# Patient Record
Sex: Female | Born: 1975 | Race: White | Hispanic: No | Marital: Married | Smoking: Current every day smoker
Health system: Southern US, Community
[De-identification: ages and names within clinical notes are randomized; demographics above are authoritative.]

## PROBLEM LIST (undated history)

## (undated) DIAGNOSIS — N912 Amenorrhea, unspecified: Secondary | ICD-10-CM

## (undated) DIAGNOSIS — N926 Irregular menstruation, unspecified: Secondary | ICD-10-CM

## (undated) HISTORY — DX: Irregular menstruation, unspecified: N92.6

## (undated) HISTORY — PX: OTHER SURGICAL HISTORY: SHX169

## (undated) HISTORY — DX: Amenorrhea, unspecified: N91.2

---

## 2005-12-11 ENCOUNTER — Emergency Department: Payer: Self-pay | Admitting: Unknown Physician Specialty

## 2007-01-15 ENCOUNTER — Emergency Department: Payer: Self-pay | Admitting: Emergency Medicine

## 2007-04-24 ENCOUNTER — Emergency Department: Payer: Self-pay | Admitting: Unknown Physician Specialty

## 2007-05-03 ENCOUNTER — Emergency Department: Payer: Self-pay | Admitting: Emergency Medicine

## 2016-11-11 ENCOUNTER — Ambulatory Visit (INDEPENDENT_AMBULATORY_CARE_PROVIDER_SITE_OTHER): Payer: Medicaid Other

## 2016-11-11 ENCOUNTER — Ambulatory Visit (INDEPENDENT_AMBULATORY_CARE_PROVIDER_SITE_OTHER): Payer: Medicaid Other | Admitting: Obstetrics and Gynecology

## 2016-11-11 VITALS — BP 117/85 | HR 96 | Ht 64.0 in | Wt 254.3 lb

## 2016-11-11 DIAGNOSIS — Z1389 Encounter for screening for other disorder: Secondary | ICD-10-CM

## 2016-11-11 DIAGNOSIS — Z3A08 8 weeks gestation of pregnancy: Secondary | ICD-10-CM

## 2016-11-11 DIAGNOSIS — Z8489 Family history of other specified conditions: Secondary | ICD-10-CM | POA: Diagnosis not present

## 2016-11-11 DIAGNOSIS — Z3481 Encounter for supervision of other normal pregnancy, first trimester: Secondary | ICD-10-CM | POA: Diagnosis not present

## 2016-11-11 DIAGNOSIS — Z113 Encounter for screening for infections with a predominantly sexual mode of transmission: Secondary | ICD-10-CM

## 2016-11-11 DIAGNOSIS — IMO0001 Reserved for inherently not codable concepts without codable children: Secondary | ICD-10-CM

## 2016-11-11 DIAGNOSIS — Z369 Encounter for antenatal screening, unspecified: Secondary | ICD-10-CM

## 2016-11-11 DIAGNOSIS — N926 Irregular menstruation, unspecified: Secondary | ICD-10-CM

## 2016-11-11 DIAGNOSIS — K219 Gastro-esophageal reflux disease without esophagitis: Secondary | ICD-10-CM

## 2016-11-11 DIAGNOSIS — T7589XA Other specified effects of external causes, initial encounter: Secondary | ICD-10-CM

## 2016-11-11 LAB — POCT URINALYSIS DIPSTICK
Bilirubin, UA: NEGATIVE
Glucose, UA: NEGATIVE
Ketones, UA: NEGATIVE
LEUKOCYTES UA: NEGATIVE
NITRITE UA: NEGATIVE
PH UA: 6
Protein, UA: NEGATIVE
RBC UA: NEGATIVE
Spec Grav, UA: 1.025
UROBILINOGEN UA: NEGATIVE

## 2016-11-11 NOTE — Patient Instructions (Signed)
Pregnancy and Zika Virus Disease Introduction Zika virus disease, or Zika, is an illness that can spread to people from mosquitoes that carry the virus. It may also spread from person to person through infected body fluids. Zika first occurred in [place], but recently it has spread to new areas. The virus occurs in tropical climates. The location of Zika continues to change. Most people who become infected with Zika virus do not develop serious illness. However, Zika may cause birth defects in an unborn baby whose mother is infected with the virus. It may also increase the risk of miscarriage. What are the symptoms of Zika virus disease? In many cases, people who have been infected with Zika virus do not develop any symptoms. If symptoms appear, they usually start about a week after the person is infected. Symptoms are usually mild. They may include:  Fever.  Rash.  Red eyes.  Joint pain. How does Zika virus disease spread? The main way that Zika virus spreads is through the bite of a certain type of mosquito. Unlike most types of mosquitos, which bite only at night, the type of mosquito that carries Zika virus bites both at night and during the day. Zika virus can also spread through sexual contact, through a blood transfusion, and from a mother to her baby before or during birth. Once you have had Zika virus disease, it is unlikely that you will get it again. Can I pass Zika to my baby during pregnancy? Yes, Zika can pass from a mother to her baby before or during birth. What problems can Zika cause for my baby? A woman who is infected with Zika virus while pregnant is at risk of having her baby born with a condition in which the brain or head is smaller than expected (microcephaly). Babies who have microcephaly can have developmental delays, seizures, hearing problems, and vision problems. Having Zika virus disease during pregnancy can also increase the risk of miscarriage. How can Zika  virus disease be prevented? There is no vaccine to prevent Zika. The best way to prevent the disease is to avoid infected mosquitoes and avoid exposure to body fluids that can spread the virus. Avoid any possible exposure to Zika by taking the following precautions. For women and their sex partners:  Avoid traveling to high-risk areas. The locations where Zika is being reported change often. To identify high-risk areas, check the CDC travel website: www.cdc.gov/zika/geo/index.html  If you or your sex partner must travel to a high-risk area, talk with a health care provider before and after traveling.  Take all precautions to avoid mosquito bites if you live in, or travel to, any of the high-risk areas. Insect repellents are safe to use during pregnancy.  Ask your health care provider when it is safe to have sexual contact. For women:  If you are pregnant or trying to become pregnant, avoid sexual contact with persons who may have been exposed to Zika virus, persons who have possible symptoms of Zika, or persons whose history you are unsure about. If you choose to have sexual contact with someone who may have been exposed to Zika virus, use condoms correctly during the entire duration of sexual activity, every time. Do not share sexual devices, as you may be exposed to body fluids.  Ask your health care provider about when it is safe to attempt pregnancy after a possible exposure to Zika virus. What steps should I take to avoid mosquito bites? Take these steps to avoid mosquito bites when you   are in a high-risk area:  Wear loose clothing that covers your arms and legs.  Limit your outdoor activities.  Do not open windows unless they have window screens.  Sleep under mosquito nets.  Use insect repellent. The best insect repellents have:  DEET, picaridin, oil of lemon eucalyptus (OLE), or [id number] in them.  Higher amounts of an active ingredient in them.  Remember that insect repellents  are safe to use during pregnancy.  Do not use OLE on children who are younger than 3 years of age. Do not use insect repellent on babies who are younger than 2 months of age.  Cover your child's stroller with mosquito netting. Make sure the netting fits snugly and that any loose netting does not cover your child's mouth or nose. Do not use a blanket as a mosquito-protection cover.  Do not apply insect repellent underneath clothing.  If you are using sunscreen, apply the sunscreen before applying the insect repellent.  Treat clothing with permethrin. Do not apply permethrin directly to your skin. Follow label directions for safe use.  Get rid of standing water, where mosquitoes may reproduce. Standing water is often found in items such as buckets, bowls, animal food dishes, and flowerpots. When you return from traveling to any high-risk area, continue taking actions to protect yourself against mosquito bites for 3 weeks, even if you show no signs of illness. This will prevent spreading Zika virus to uninfected mosquitoes. What should I know about the sexual transmission of Zika? People can spread Zika to their sexual partners during vaginal, anal, or oral sex, or by sharing sexual devices. Many people with Zika do not develop symptoms, so a person could spread the disease without knowing that they are infected. The greatest risk is to women who are pregnant or who may become pregnant. Zika virus can live longer in semen than it can live in blood. Couples can prevent sexual transmission of the virus by:  Using condoms correctly during the entire duration of sexual activity, every time. This includes vaginal, anal, and oral sex.  Not sharing sexual devices. Sharing increases your risk of being exposed to body fluid from another person.  Avoiding all sexual activity until your health care provider says it is safe. Should I be tested for Zika virus? A sample of your blood can be tested for Zika  virus. A pregnant woman should be tested if she may have been exposed to the virus or if she has symptoms of Zika. She may also have additional tests done during her pregnancy, such ultrasound testing. Talk with your health care provider about which tests are recommended. This information is not intended to replace advice given to you by your health care provider. Make sure you discuss any questions you have with your health care provider. Document Released: 08/13/2015 Document Revised: 04/29/2016 Document Reviewed: 08/06/2015  2017 Elsevier Minor Illnesses and Medications in Pregnancy  Cold/Flu:  Sudafed for congestion- Robitussin (plain) for cough- Tylenol for discomfort.  Please follow the directions on the label.  Try not to take any more than needed.  OTC Saline nasal spray and air humidifier or cool-mist  Vaporizer to sooth nasal irritation and to loosen congestion.  It is also important to increase intake of non carbonated fluids, especially if you have a fever.  Constipation:  Colace-2 capsules at bedtime; Metamucil- follow directions on label; Senokot- 1 tablet at bedtime.  Any one of these medications can be used.  It is also very important to increase   fluids and fruits along with regular exercise.  If problem persists please call the office.  Diarrhea:  Kaopectate as directed on the label.  Eat a bland diet and increase fluids.  Avoid highly seasoned foods.  Headache:  Tylenol 1 or 2 tablets every 3-4 hours as needed  Indigestion:  Maalox, Mylanta, Tums or Rolaids- as directed on label.  Also try to eat small meals and avoid fatty, greasy or spicy foods.  Nausea with or without Vomiting:  Nausea in pregnancy is caused by increased levels of hormones in the body which influence the digestive system and cause irritation when stomach acids accumulate.  Symptoms usually subside after 1st trimester of pregnancy.  Try the following: 1. Keep saltines, graham crackers or dry toast by your bed to  eat upon awakening. 2. Don't let your stomach get empty.  Try to eat 5-6 small meals per day instead of 3 large ones. 3. Avoid greasy fatty or highly seasoned foods.  4. Take OTC Unisom 1 tablet at bed time along with OTC Vitamin B6 25-50 mg 3 times per day.    If nausea continues with vomiting and you are unable to keep down food and fluids you may need a prescription medication.  Please notify your provider.   Sore throat:  Chloraseptic spray, throat lozenges and or plain Tylenol.  Vaginal Yeast Infection:  OTC Monistat for 7 days as directed on label.  If symptoms do not resolve within a week notify provider.  If any of the above problems do not subside with recommended treatment please call the office for further assistance.   Do not take Aspirin, Advil, Motrin or Ibuprofen.  * * OTC= Over the counter Hyperemesis Gravidarum Hyperemesis gravidarum is a severe form of nausea and vomiting that happens during pregnancy. Hyperemesis is worse than morning sickness. It may cause you to have nausea or vomiting all day for many days. It may keep you from eating and drinking enough food and liquids. Hyperemesis usually occurs during the first half (the first 20 weeks) of pregnancy. It often goes away once a woman is in her second half of pregnancy. However, sometimes hyperemesis continues through an entire pregnancy. What are the causes? The cause of this condition is not known. It may be related to changes in chemicals (hormones) in the body during pregnancy, such as the high level of pregnancy hormone (human chorionic gonadotropin) or the increase in the female sex hormone (estrogen). What are the signs or symptoms? Symptoms of this condition include:  Severe nausea and vomiting.  Nausea that does not go away.  Vomiting that does not allow you to keep any food down.  Weight loss.  Body fluid loss (dehydration).  Having no desire to eat, or not liking food that you have previously  enjoyed. How is this diagnosed? This condition may be diagnosed based on:  A physical exam.  Your medical history.  Your symptoms.  Blood tests.  Urine tests. How is this treated? This condition may be managed with medicine. If medicines to do not help relieve nausea and vomiting, you may need to receive fluids through an IV tube at the hospital. Follow these instructions at home:  Take over-the-counter and prescription medicines only as told by your health care provider.  Avoid iron pills and multivitamins that contain iron for the first 3-4 months of pregnancy. If you take prescription iron pills, do not stop taking them unless your health care provider approves.  Take the following actions to help   prevent nausea and vomiting:  In the morning, before getting out of bed, try eating a couple of dry crackers or a piece of toast.  Avoid foods and smells that upset your stomach. Fatty and spicy foods may make nausea worse.  Eat 5-6 small meals a day.  Do not drink fluids while eating meals. Drink between meals.  Eat or suck on things that have ginger in them. Ginger can help relieve nausea.  Avoid food preparation. The smell of food can spoil your appetite or trigger nausea.  Follow instructions from your health care provider about eating or drinking restrictions.  For snacks, eat high-protein foods, such as cheese.  Keep all follow-up and pre-birth (prenatal) visits as told by your health care provider. This is important. Contact a health care provider if:  You have pain in your abdomen.  You have a severe headache.  You have vision problems.  You are losing weight. Get help right away if:  You cannot drink fluids without vomiting.  You vomit blood.  You have constant nausea and vomiting.  You are very weak.  You are very thirsty.  You feel dizzy.  You faint.  You have a fever or other symptoms that last for more than 2-3 days.  You have a fever and  your symptoms suddenly get worse. Summary  Hyperemesis gravidarum is a severe form of nausea and vomiting that happens during pregnancy.  Making some changes to your eating habits may help relieve nausea and vomiting.  This condition may be managed with medicine.  If medicines to do not help relieve nausea and vomiting, you may need to receive fluids through an IV tube at the hospital. This information is not intended to replace advice given to you by your health care provider. Make sure you discuss any questions you have with your health care provider. Document Released: 11/22/2005 Document Revised: 07/21/2016 Document Reviewed: 07/21/2016 Elsevier Interactive Patient Education  2017 [employer] Commonly Asked Questions During Pregnancy  Cats: A parasite can be excreted in cat feces.  To avoid exposure you need to have another person empty the little box.  If you must empty the litter box you will need to wear gloves.  Wash your hands after handling your cat.  This parasite can also be found in raw or undercooked meat so this should also be avoided.  Colds, Sore Throats, Flu: Please check your medication sheet to see what you can take for symptoms.  If your symptoms are unrelieved by these medications please call the office.  Dental Work: Most any dental work your dentist recommends is permitted.  X-rays should only be taken during the first trimester if absolutely necessary.  Your abdomen should be shielded with a lead apron during all x-rays.  Please notify your provider prior to receiving any x-rays.  Novocaine is fine; gas is not recommended.  If your dentist requires a note from us prior to dental work please call the office and we will provide one for you.  Exercise: Exercise is an important part of staying healthy during your pregnancy.  You may continue most exercises you were accustomed to prior to pregnancy.  Later in your pregnancy you will most likely notice you have difficulty  with activities requiring balance like riding a bicycle.  It is important that you listen to your body and avoid activities that put you at a higher risk of falling.  Adequate rest and staying well hydrated are a must!  If you have questions   about the safety of specific activities ask your provider.    Exposure to Children with illness: Try to avoid obvious exposure; report any symptoms to us when noted,  If you have chicken pos, red measles or mumps, you should be immune to these diseases.   Please do not take any vaccines while pregnant unless you have checked with your OB provider.  Fetal Movement: After 28 weeks we recommend you do "kick counts" twice daily.  Lie or sit down in a calm quiet environment and count your baby movements "kicks".  You should feel your baby at least 10 times per hour.  If you have not felt 10 kicks within the first hour get up, walk around and have something sweet to eat or drink then repeat for an additional hour.  If count remains less than 10 per hour notify your provider.  Fumigating: Follow your pest control agent's advice as to how long to stay out of your home.  Ventilate the area well before re-entering.  Hemorrhoids:   Most over-the-counter preparations can be used during pregnancy.  Check your medication to see what is safe to use.  It is important to use a stool softener or fiber in your diet and to drink lots of liquids.  If hemorrhoids seem to be getting worse please call the office.   Hot Tubs:  Hot tubs Jacuzzis and saunas are not recommended while pregnant.  These increase your internal body temperature and should be avoided.  Intercourse:  Sexual intercourse is safe during pregnancy as long as you are comfortable, unless otherwise advised by your provider.  Spotting may occur after intercourse; report any bright red bleeding that is heavier than spotting.  Labor:  If you know that you are in labor, please go to the hospital.  If you are unsure, please  call the office and let us help you decide what to do.  Lifting, straining, etc:  If your job requires heavy lifting or straining please check with your provider for any limitations.  Generally, you should not lift items heavier than that you can lift simply with your hands and arms (no back muscles)  Painting:  Paint fumes do not harm your pregnancy, but may make you ill and should be avoided if possible.  Latex or water based paints have less odor than oils.  Use adequate ventilation while painting.  Permanents & Hair Color:  Chemicals in hair dyes are not recommended as they cause increase hair dryness which can increase hair loss during pregnancy.  " Highlighting" and permanents are allowed.  Dye may be absorbed differently and permanents may not hold as well during pregnancy.  Sunbathing:  Use a sunscreen, as skin burns easily during pregnancy.  Drink plenty of fluids; avoid over heating.  Tanning Beds:  Because their possible side effects are still unknown, tanning beds are not recommended.  Ultrasound Scans:  Routine ultrasounds are performed at approximately 20 weeks.  You will be able to see your baby's general anatomy an if you would like to know the gender this can usually be determined as well.  If it is questionable when you conceived you may also receive an ultrasound early in your pregnancy for dating purposes.  Otherwise ultrasound exams are not routinely performed unless there is a medical necessity.  Although you can request a scan we ask that you pay for it when conducted because insurance does not cover " patient request" scans.  Work: If your pregnancy proceeds without complications you   may work until your due date, unless your physician or employer advises otherwise.  Round Ligament Pain/Pelvic Discomfort:  Sharp, shooting pains not associated with bleeding are fairly common, usually occurring in the second trimester of pregnancy.  They tend to be worse when standing up or when  you remain standing for long periods of time.  These are the result of pressure of certain pelvic ligaments called "round ligaments".  Rest, Tylenol and heat seem to be the most effective relief.  As the womb and fetus grow, they rise out of the pelvis and the discomfort improves.  Please notify the office if your pain seems different than that described.  It may represent a more serious condition.   

## 2016-11-11 NOTE — Progress Notes (Signed)
Amy CaveKimberly Beasley presents for NOB nurse interview visit. Pregnancy confirmation done @ [facility] on 11/04/2016 with positive UPT.   G-4. [id number]. Pregnancy education material explained and given. Has cats in the home. Toxoplasmosis labs ordered.  NOB labs ordered. TSH/HbgA1c due to Increased BMI, HIV labs and Drug screen were explained optional and she did not decline. Drug screen ordered. Pt c/o lower abdominal pain and frequency urinating. Urinalysis negative in office today. Urine will also be sent to lab for ua and uc. PNV encouraged. Genetic screening, pt will most likely do the MaterniT after 9 wks. Is aware to call to make an appt with lab, or may discuss with provider. Ultrasound ordered because of hx irregular menses, FHX  maternal and paternal fraternal and identical twins, most on mother's side who is an identical twin. Pt. To follow up with provider in 4 weeks for NOB physical.  All questions answered.

## 2016-11-13 LAB — CBC WITH DIFFERENTIAL/PLATELET
BASOS: 0 %
Basophils Absolute: 0 10*3/uL (ref 0.0–0.2)
EOS (ABSOLUTE): 0.2 10*3/uL (ref 0.0–0.4)
EOS: 3 %
HEMATOCRIT: 40.9 % (ref 34.0–46.6)
HEMOGLOBIN: 14.1 g/dL (ref 11.1–15.9)
IMMATURE GRANS (ABS): 0 10*3/uL (ref 0.0–0.1)
Immature Granulocytes: 0 %
LYMPHS ABS: 2.5 10*3/uL (ref 0.7–3.1)
LYMPHS: 31 %
MCH: 29.6 pg (ref 26.6–33.0)
MCHC: 34.5 g/dL (ref 31.5–35.7)
MCV: 86 fL (ref 79–97)
MONOCYTES: 7 %
Monocytes Absolute: 0.5 10*3/uL (ref 0.1–0.9)
NEUTROS ABS: 4.7 10*3/uL (ref 1.4–7.0)
Neutrophils: 59 %
Platelets: 283 10*3/uL (ref 150–379)
RBC: 4.77 x10E6/uL (ref 3.77–5.28)
RDW: 14.2 % (ref 12.3–15.4)
WBC: 8 10*3/uL (ref 3.4–10.8)

## 2016-11-13 LAB — TOXOPLASMA ANTIBODIES- IGG AND  IGM: Toxoplasma Antibody- IgM: <3 AU/mL (ref 0.0–7.9)

## 2016-11-13 LAB — MONITOR DRUG PROFILE 14(MW)
Amphetamine Scrn, Ur: NEGATIVE ng/mL
BARBITURATE SCREEN URINE: NEGATIVE ng/mL
BENZODIAZEPINE SCREEN, URINE: NEGATIVE ng/mL
BUPRENORPHINE, URINE: NEGATIVE ng/mL
CANNABINOIDS UR QL SCN: NEGATIVE ng/mL
COCAINE(METAB.)SCREEN, URINE: NEGATIVE ng/mL
CREATININE(CRT), U: 29.2 mg/dL (ref 20.0–300.0)
Fentanyl, Urine: NEGATIVE pg/mL
MEPERIDINE SCREEN, URINE: NEGATIVE ng/mL
METHADONE SCREEN, URINE: NEGATIVE ng/mL
OPIATE SCREEN URINE: NEGATIVE ng/mL
OXYCODONE+OXYMORPHONE UR QL SCN: NEGATIVE ng/mL
Ph of Urine: 6 (ref 4.5–8.9)
Phencyclidine Qn, Ur: NEGATIVE ng/mL
Propoxyphene Scrn, Ur: NEGATIVE ng/mL
SPECIFIC GRAVITY: 1.007
TRAMADOL SCREEN, URINE: NEGATIVE ng/mL

## 2016-11-13 LAB — URINALYSIS, ROUTINE W REFLEX MICROSCOPIC
BILIRUBIN UA: NEGATIVE
Glucose, UA: NEGATIVE
Ketones, UA: NEGATIVE
LEUKOCYTES UA: NEGATIVE
Nitrite, UA: NEGATIVE
PH UA: 6 (ref 5.0–7.5)
PROTEIN UA: NEGATIVE
RBC UA: NEGATIVE
Specific Gravity, UA: 1.007 (ref 1.005–1.030)
Urobilinogen, Ur: 0.2 mg/dL (ref 0.2–1.0)

## 2016-11-13 LAB — NICOTINE SCREEN, URINE: Cotinine Ql Scrn, Ur: POSITIVE ng/mL

## 2016-11-13 LAB — VARICELLA ZOSTER ANTIBODY, IGG: Varicella zoster IgG: 885 index (ref >165)

## 2016-11-13 LAB — HEMOGLOBIN A1C
ESTIMATED AVERAGE GLUCOSE: 111 mg/dL
Hgb A1c MFr Bld: 5.5 % (ref 4.8–5.6)

## 2016-11-13 LAB — ANTIBODY SCREEN: Antibody Screen: NEGATIVE

## 2016-11-13 LAB — RH TYPE: RH TYPE: POSITIVE

## 2016-11-13 LAB — HEPATITIS B SURFACE ANTIGEN: Hepatitis B Surface Ag: NEGATIVE

## 2016-11-13 LAB — RUBELLA SCREEN: Rubella Antibodies, IGG: 3.89 index (ref >0.99)

## 2016-11-13 LAB — URINE CULTURE, OB REFLEX

## 2016-11-13 LAB — RPR: RPR Ser Ql: NONREACTIVE

## 2016-11-13 LAB — TSH: TSH: 0.962 u[IU]/mL (ref 0.450–4.500)

## 2016-11-13 LAB — GC/CHLAMYDIA PROBE AMP
Chlamydia trachomatis, NAA: NEGATIVE
NEISSERIA GONORRHOEAE BY PCR: NEGATIVE

## 2016-11-13 LAB — CULTURE, OB URINE

## 2016-11-13 LAB — HIV ANTIBODY (ROUTINE TESTING W REFLEX): HIV Screen 4th Generation wRfx: NONREACTIVE

## 2016-11-13 LAB — ABO

## 2016-11-13 NOTE — Progress Notes (Signed)
I have reviewed the record and concur with patient management and plan as documented by Debbie Ridgeway, LPN.  Londen Lorge, MD [facility]  

## 2016-11-24 ENCOUNTER — Other Ambulatory Visit: Payer: Medicaid Other

## 2016-11-24 DIAGNOSIS — Z3481 Encounter for supervision of other normal pregnancy, first trimester: Secondary | ICD-10-CM

## 2016-11-24 DIAGNOSIS — Z369 Encounter for antenatal screening, unspecified: Secondary | ICD-10-CM

## 2016-12-01 LAB — MATERNIT21  PLUS CORE+ESS+SCA, BLOOD
CHROMOSOME 18: NEGATIVE
CHROMOSOME 21: NEGATIVE
Chromosome 13: NEGATIVE
PDF: 0
Y Chromosome: NOT DETECTED

## 2016-12-02 ENCOUNTER — Telehealth: Payer: Self-pay

## 2016-12-02 NOTE — Telephone Encounter (Signed)
-----   Message from Hildred LaserAnika Cherry, MD sent at 12/01/2016  5:19 PM EST ----- Please inform of normal MaterniT21 testing.  If desires to know sex, is a female infant.

## 2016-12-02 NOTE — Telephone Encounter (Signed)
Called pt no answer, LM for pt to call back  

## 2016-12-03 NOTE — Telephone Encounter (Signed)
Called pt informed her of normal results. Pt gave verbal understanding.  

## 2016-12-09 ENCOUNTER — Ambulatory Visit (INDEPENDENT_AMBULATORY_CARE_PROVIDER_SITE_OTHER): Payer: Medicaid Other

## 2016-12-09 ENCOUNTER — Ambulatory Visit (INDEPENDENT_AMBULATORY_CARE_PROVIDER_SITE_OTHER): Payer: Medicaid Other | Admitting: Obstetrics and Gynecology

## 2016-12-09 VITALS — BP 107/71 | HR 103 | Wt 255.4 lb

## 2016-12-09 DIAGNOSIS — Z3481 Encounter for supervision of other normal pregnancy, first trimester: Secondary | ICD-10-CM | POA: Diagnosis not present

## 2016-12-09 DIAGNOSIS — Z124 Encounter for screening for malignant neoplasm of cervix: Secondary | ICD-10-CM | POA: Diagnosis not present

## 2016-12-09 DIAGNOSIS — O09521 Supervision of elderly multigravida, first trimester: Secondary | ICD-10-CM

## 2016-12-09 DIAGNOSIS — Z3482 Encounter for supervision of other normal pregnancy, second trimester: Secondary | ICD-10-CM | POA: Diagnosis not present

## 2016-12-09 DIAGNOSIS — Z3689 Encounter for other specified antenatal screening: Secondary | ICD-10-CM

## 2016-12-09 DIAGNOSIS — J45909 Unspecified asthma, uncomplicated: Secondary | ICD-10-CM

## 2016-12-09 DIAGNOSIS — O021 Missed abortion: Secondary | ICD-10-CM

## 2016-12-09 LAB — POCT URINALYSIS DIPSTICK
Bilirubin, UA: NEGATIVE
Glucose, UA: NEGATIVE
Ketones, UA: 80
LEUKOCYTES UA: NEGATIVE
NITRITE UA: NEGATIVE
SPEC GRAV UA: 1.02
UROBILINOGEN UA: NEGATIVE
pH, UA: 6

## 2016-12-09 MED ORDER — ALBUTEROL SULFATE HFA 108 (90 BASE) MCG/ACT IN AERS: 2.0000 | INHALATION_SPRAY | Freq: Four times a day (QID) | RESPIRATORY_TRACT | 2 refills | Status: DC | PRN

## 2016-12-09 MED ORDER — BUPROPION HCL ER (SMOKING DET) 150 MG PO TB12
150.0000 mg | ORAL_TABLET | Freq: Two times a day (BID) | ORAL | 3 refills | Status: DC
Start: 2016-12-09 — End: 2017-06-07

## 2016-12-09 NOTE — Progress Notes (Signed)
OBSTETRIC INITIAL PRENATAL VISIT  Subjective:    Amy Beasley is being seen today for her first obstetrical visit.  This is not a planned pregnancy. She is a [id number] female at [redacted]w[redacted]d gestation, Estimated Date of Delivery: 06/26/17 with Patient's last menstrual period was 09/13/2016 (approximate date), inconsistent consistent with 7 week sono. Her obstetrical history is significant for advanced maternal age. Relationship with FOB: spouse, living together. Patient does intend to breast feed. Pregnancy history fully reviewed.    Obstetric History   G4   P3   T3   P0   A0   L3    SAB0   TAB0   Ectopic0   Multiple0   Live Births3     # Outcome Date GA Lbr Len/2nd Weight Sex Delivery Anes PTL Lv  4 Current           3 Term 04/14/07 [redacted]w[redacted]d  7 lb 11 oz (3.487 kg) F Vag-Spont  N LIV  2 Term 05/22/96 [redacted]w[redacted]d  5 lb 13 oz (2.637 kg) F Vag-Spont  N LIV  1 Term 03/28/94 [redacted]w[redacted]d  5 lb 11 oz (2.58 kg) F Vag-Spont  N LIV      Gynecologic History:  Last pap smear was 2008.  Results were normal.  Reports h/o abnormal pap smear x1 in the past. Did not require colposcopy or treatment.  Denies history of STIs.    Past Medical History:  Diagnosis Date  . Amenorrhea   . Irregular menses     Family History  Problem Relation Age of Onset  . Asthma Sister   . Cancer Maternal Grandmother     breast  . Diabetes Maternal Grandmother     type 2  . Hyperlipidemia Maternal Grandmother   . Cancer Maternal Grandfather     prostate cancer  . Hypertension Maternal Grandfather   . Eczema Child     Past Surgical History:  Procedure Laterality Date  . none      Social History   Social History  . Marital status: Married    Spouse name: N/A  . Number of children: N/A  . Years of education: N/A   Occupational History  . Not on file.   Social History Main Topics  . Smoking status: Current Every Day Smoker  . Smokeless tobacco: Never Used  . Alcohol use No  . Drug use: No  . Sexual activity: Yes      Partners: Male    Birth control/ protection: None   Other Topics Concern  . Not on file   Social History Narrative  . No narrative on file    Current Outpatient Prescriptions on File Prior to Visit  Medication Sig Dispense Refill  . omeprazole (PRILOSEC) 20 MG capsule Take 20 mg by mouth daily.    . Prenatal Vit-Fe Fumarate-FA (PRENATAL VITAMINS) 28-0.8 MG TABS Take by mouth.     No current facility-administered medications on file prior to visit.      No Known Allergies   Review of Systems General:Not Present- Fever, Weight Loss and Weight Gain. Skin:Not Present- Rash. HEENT:Not Present- Blurred Vision, Headache and Bleeding Gums. Respiratory:Present- Difficulty Breathing with exertion, mostly in cold weather.  Also notes issues in the past with difficulty with breathing after moving into a new home (thinks it was due to dust mites). Not present - negative for - cough or wheezing  Breast:Not Present- Breast Mass. Cardiovascular:Not Present- Chest Pain, Elevated Blood Pressure, Fainting / Blacking Out and Shortness of Breath. Gastrointestinal:Not Present-  Abdominal Pain, Constipation, Nausea and Vomiting. Female Genitourinary:Not Present- Frequency, Painful Urination, Pelvic Pain, Vaginal Bleeding, Vaginal Discharge, Contractions, regular, Fetal Movements Decreased, Urinary Complaints and Vaginal Fluid. Musculoskeletal:Not Present- Back Pain and Leg Cramps. Neurological:Not Present- Dizziness. Psychiatric:Not Present- Depression.     Objective:   Blood pressure 107/71, pulse (!) 103, weight 255 lb 6.4 oz (115.8 kg), last menstrual period 09/13/2016.  Body mass index is 43.84 kg/m.   General Appearance:    Alert, cooperative, no distress, appears stated age, morbid obesity  Head:    Normocephalic, without obvious abnormality, atraumatic  Eyes:    PERRL, conjunctiva/corneas clear, EOM's intact, both eyes  Ears:    Normal external ear canals, both ears  Nose:    Nares normal, septum midline, mucosa normal, no drainage or sinus tenderness  Throat:   Lips, mucosa, and tongue normal; teeth and gums normal  Neck:   Supple, symmetrical, trachea midline, no adenopathy; thyroid: no enlargement/tenderness/nodules; no carotid bruit or JVD  Back:     Symmetric, no curvature, ROM normal, no CVA tenderness  Lungs:     Clear to auscultation bilaterally, respirations unlabored  Chest Wall:    No tenderness or deformity   Heart:    Regular rate and rhythm, S1 and S2 normal, no murmur, rub or gallop  Breast Exam:    No tenderness, masses, or nipple abnormality  Abdomen:     Soft, non-tender, bowel sounds active all four quadrants, no masses, no organomegaly.  FH unable to determine due to body habitus.  FHT unable to determine by Dopplers.  Genitalia:    Pelvic:external genitalia normal, vagina without lesions, discharge, or tenderness, rectovaginal septum  normal. Cervix normal in appearance, no cervical motion tenderness, no adnexal masses or tenderness.  Pregnancy positive findings: uterine enlargement: 8-10 wk size, nontender.   Rectal:    Normal external sphincter.  No hemorrhoids appreciated. Internal exam not done.   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 Prenatal vitamins encouraged. Problem list reviewed and updated. New OB counseling:  The patient has been given an overview regarding routine prenatal care.  Recommendations regarding diet, weight gain, and exercise in pregnancy were given.   Patient should aim to gain no more than 15-20 lbs for entire pregnancy.  Prenatal testing, optional genetic testing, and ultrasound use in pregnancy were reviewed.  AFP3 discussed: declined.  Patient had MaterniT21 testing which noted normal fetus.  Benefits of Breast Feeding were discussed. The patient is encouraged to consider nursing her baby post partum. Prescribed albuterol inhaler for asthma.  Patient notes that she has tried several antihistamines, including Zyrtec, CLaritin, and Benadryl which do not help.  Is currently taking Chlorpheniramine (is ok to take In pregnancy).   Will need early glucola secondary to morbid obesity.  Discussed smoking cessation.  Patient currently smoking 1/2 to 3/4 ppd.  Has tried nicotine gum and patch in the past with no success.  Advised on Bupropion vs Chantix, including risks vs benefits in pregnancy. .  Patient desires to  try Bupropion.  Prescription given. Also given smoking cessation tools including website and hotline.  Follow up in 4 weeks.  50% of 30 min visit spent on counseling and coordination of care.       Addendum 12/10/2015, 3:48 PM:  Ultrasound performed due to inability to assess heart tones by Doppler.  Ultrasound notes fetal demise at ~ [redacted] weeks gestation.  Discussed ultrasound results with patient.  All questions answered.  Discussed management of missed abortion: expectant management vs misoprostol vs D&E.  Risks and benefits of all modalities discussed; all questions answered.  Patient opted for expectant management for now.  She was told to call if she changes her mind, or if there is no passage of tissue in 4 weeks or develops any symptoms of infection.  Bleeding precautions reviewed; she was told to call clinic or come to the Emergency Roomfor any concerns.  She will have a BHCG drawn tomorrow, and repeat on Monday (for patient reassurance that pregnancy has ended).   If patient changes her mind and opts for D&E, she was advised to call  the office to have procedure scheduled.  Risks of surgery including bleeding, infection, injury to surrounding organs, need for additional procedures, possibility of intrauterine scarring which may impair future fertility, risk of retained products which may require further management and other postoperative/anesthesia complications will be explained to patient.  If she changes her mind and wants misoprostol administration, she will need to contact the office for a prescription.  Risks and benefits of medical management were carefully explained, including a success rate of 80-90%, the need for another person to be at home with her, and to call/come in if she had heavy bleeding, dizziness, or severe pain not relieved by medication.  She will follow up in one week after misoprostol administration; if there has been no passage of pregnancy, will consider repeat misoprostol vs D&E.  Patient should again be advised to call or come in for evaluation for any concerning symptoms; bleeding precautions should be strictly emphasized.    An additional 20 minutes were spent on counseling patient.    Hildred LaserAnika Juanmanuel Marohl, MD [facility]

## 2016-12-09 NOTE — Patient Instructions (Addendum)

## 2016-12-10 ENCOUNTER — Other Ambulatory Visit: Payer: Medicaid Other

## 2016-12-10 ENCOUNTER — Encounter: Payer: Self-pay | Admitting: Obstetrics and Gynecology

## 2016-12-10 DIAGNOSIS — O021 Missed abortion: Secondary | ICD-10-CM

## 2016-12-11 LAB — PAP IG AND HPV HIGH-RISK
HPV, high-risk: NEGATIVE
PAP Smear Comment: 0

## 2016-12-11 LAB — BETA HCG QUANT (REF LAB): hCG Quant: 2585 m[IU]/mL

## 2016-12-12 ENCOUNTER — Encounter: Payer: Self-pay | Admitting: Emergency Medicine

## 2016-12-12 ENCOUNTER — Emergency Department: Payer: Medicaid Other

## 2016-12-12 ENCOUNTER — Emergency Department
Admission: EM | Admit: 2016-12-12 | Discharge: 2016-12-12 | Disposition: A | Discharge status: Home / Self Care | Payer: Medicaid Other | Attending: Emergency Medicine | Admitting: Emergency Medicine

## 2016-12-12 DIAGNOSIS — O039 Complete or unspecified spontaneous abortion without complication: Secondary | ICD-10-CM | POA: Insufficient documentation

## 2016-12-12 DIAGNOSIS — F17200 Nicotine dependence, unspecified, uncomplicated: Secondary | ICD-10-CM | POA: Insufficient documentation

## 2016-12-12 DIAGNOSIS — Z3689 Encounter for other specified antenatal screening: Secondary | ICD-10-CM

## 2016-12-12 DIAGNOSIS — N898 Other specified noninflammatory disorders of vagina: Secondary | ICD-10-CM | POA: Insufficient documentation

## 2016-12-12 DIAGNOSIS — Z79899 Other long term (current) drug therapy: Secondary | ICD-10-CM | POA: Diagnosis not present

## 2016-12-12 DIAGNOSIS — O99331 Smoking (tobacco) complicating pregnancy, first trimester: Secondary | ICD-10-CM | POA: Diagnosis not present

## 2016-12-12 DIAGNOSIS — Z3482 Encounter for supervision of other normal pregnancy, second trimester: Secondary | ICD-10-CM

## 2016-12-12 DIAGNOSIS — O4690 Antepartum hemorrhage, unspecified, unspecified trimester: Secondary | ICD-10-CM

## 2016-12-12 LAB — CBC WITH DIFFERENTIAL/PLATELET
Basophils Absolute: 0.2 10*3/uL — ABNORMAL HIGH (ref 0–0.1)
Basophils Relative: 3 %
Eosinophils Absolute: 0.3 10*3/uL (ref 0–0.7)
Eosinophils Relative: 3 %
HCT: 42.8 % (ref 35.0–47.0)
Hemoglobin: 14.4 g/dL (ref 12.0–16.0)
LYMPHS ABS: 1.1 10*3/uL (ref 1.0–3.6)
LYMPHS PCT: 13 %
MCH: 30.1 pg (ref 26.0–34.0)
MCHC: 33.8 g/dL (ref 32.0–36.0)
MCV: 89.2 fL (ref 80.0–100.0)
MONO ABS: 0.4 10*3/uL (ref 0.2–0.9)
MONOS PCT: 4 %
Neutro Abs: 6.7 10*3/uL — ABNORMAL HIGH (ref 1.4–6.5)
Neutrophils Relative %: 77 %
Platelets: 269 10*3/uL (ref 150–440)
RBC: 4.79 MIL/uL (ref 3.80–5.20)
RDW: 14.1 % (ref 11.5–14.5)
WBC: 8.7 10*3/uL (ref 3.6–11.0)

## 2016-12-12 LAB — TYPE AND SCREEN
ABO/RH(D): A POS
Antibody Screen: NEGATIVE

## 2016-12-12 LAB — BASIC METABOLIC PANEL
Anion gap: 6 (ref 5–15)
BUN: 6 mg/dL (ref 6–20)
CHLORIDE: 100 mmol/L — AB (ref 101–111)
CO2: 29 mmol/L (ref 22–32)
CREATININE: 0.66 mg/dL (ref 0.44–1.00)
Calcium: 9.4 mg/dL (ref 8.9–10.3)
GFR calc Af Amer: >60 mL/min (ref >60)
GFR calc non Af Amer: >60 mL/min (ref >60)
GLUCOSE: 111 mg/dL — AB (ref 65–99)
Potassium: 4 mmol/L (ref 3.5–5.1)
SODIUM: 135 mmol/L (ref 135–145)

## 2016-12-12 LAB — HCG, QUANTITATIVE, PREGNANCY: hCG, Beta Chain, Quant, S: 2355 m[IU]/mL — ABNORMAL HIGH (ref <5)

## 2016-12-12 NOTE — ED Triage Notes (Signed)
Pt states that at her appointment at [facility] she was informed that her baby had not grown in about four weeks. Pt states that the on-call nurse told her to come in to the ED tonight as she has bled through eight pads since 2000. Pt is ambulatory to triage with NAD at this time.

## 2016-12-12 NOTE — Discharge Instructions (Signed)
Return to emergency department if he starts developing heavy vaginal bleeding again including going through full pad every 2 hours for 4 consecutive hours. Please drink plenty of fluids and take over-the-counter pain medications and continue with your follow-up with your OB/GYN for recheck of your serum quantitative pregnancy levels

## 2016-12-12 NOTE — ED Provider Notes (Signed)
Time Seen: Approximately 0558  I have reviewed the triage notes  Chief Complaint: Threatened Miscarriage   History of Present Illness: Amy Beasley is a 41 y.o. female who has been told by her OB/GYN that she has a nonprogressive pregnancy at stopped growing at approximately 4 weeks. Patient has been followed by her OB/GYN and was advised that she would likely had a miscarriage this weekend. Patient was concern after she bled through 8 pads since 8 PM the previous night. By the time I saw the patient she states her bleeding has slowed at this time. The patient is historical gravida 4 para 3. She denies any abdominal pain at this time.*   Past Medical History:  Diagnosis Date  . Amenorrhea   . Irregular menses     There are no active problems to display for this patient.   Past Surgical History:  Procedure Laterality Date  . none      Past Surgical History:  Procedure Laterality Date  . none      Current Outpatient Rx  . Order #: [id number] Class: Print  . Order #: [id number] Class: Print  . Order #: [id number] Class: Historical Med  . Order #: [id number] Class: Historical Med    Allergies:  Patient has no known allergies.  Family History: Family History  Problem Relation Age of Onset  . Asthma Sister   . Cancer Maternal Grandmother     breast  . Diabetes Maternal Grandmother     type 2  . Hyperlipidemia Maternal Grandmother   . Cancer Maternal Grandfather     prostate cancer  . Hypertension Maternal Grandfather   . Eczema Child     Social History: Social History  Substance Use Topics  . Smoking status: Current Every Day Smoker    Packs/day: 0.50  . Smokeless tobacco: Never Used  . Alcohol use No     Review of Systems:   10 point review of systems was performed and was otherwise negative:  Constitutional: No fever Eyes: No visual disturbances ENT: No sore throat, ear pain Cardiac: No chest pain Respiratory: No shortness of breath, wheezing, or  stridor Abdomen: No abdominal pain, no vomiting, No diarrhea Endocrine: No weight loss, No night sweats Extremities: No peripheral edema, cyanosis Skin: No rashes, easy bruising Neurologic: No focal weakness, trouble with speech or swollowing Urologic: No dysuria, Hematuria, or urinary frequency   Physical Exam:  ED Triage Vitals  Enc Vitals Group     BP 12/12/16 0115 130/71     Pulse Rate 12/12/16 0115 97     Resp 12/12/16 0115 20     Temp 12/12/16 0115 98.4 F (36.9 C)     Temp Source 12/12/16 0115 Oral     SpO2 12/12/16 0115 99 %     Weight 12/12/16 0116 250 lb (113.4 kg)     Height 12/12/16 0116 5\' 4"  (1.626 m)     Head Circumference --      Peak Flow --      Pain Score 12/12/16 0122 6     Pain Loc --      Pain Edu? --      Excl. in GC? --     General: Awake , Alert , and Oriented times 3; GCS 15 Head: Normal cephalic , atraumatic Eyes: Pupils equal , round, reactive to light Nose/Throat: No nasal drainage, patent upper airway without erythema or exudate.  Neck: Supple, Full range of motion, No anterior adenopathy or palpable thyroid masses Lungs: Clear to ascultation without  wheezes , rhonchi, or rales Heart: Regular rate, regular rhythm without murmurs , gallops , or rubs Abdomen: Soft, non tender without rebound, guarding , or rigidity; bowel sounds positive and symmetric in all 4 quadrants. No organomegaly .        Extremities: 2 plus symmetric pulses. No edema, clubbing or cyanosis Neurologic: normal ambulation, Motor symmetric without deficits, sensory intact Skin: warm, dry, no rashes Pelvic exam was deferred for ultrasound evaluation  Labs:   All laboratory work was reviewed including any pertinent negatives or positives listed below:  Labs Reviewed  HCG, QUANTITATIVE, PREGNANCY - Abnormal; Notable for the following:       Result Value   hCG, Beta Chain, Quant, S 2,355 (*)    All other components within normal limits  CBC WITH DIFFERENTIAL/PLATELET -  Abnormal; Notable for the following:    Neutro Abs 6.7 (*)    Basophils Absolute 0.2 (*)    All other components within normal limits  BASIC METABOLIC PANEL - Abnormal; Notable for the following:    Chloride 100 (*)    Glucose, Bld 111 (*)    All other components within normal limits  TYPE AND SCREEN      Radiology:  "US Ob Comp Less 14 Wks  Result Date: 12/12/2016 CLINICAL DATA:  41 y/o  F; 6 hours of vaginal bleeding. EXAM: OBSTETRIC <14 WK [place] AND TRANSVAGINAL OB US TECHNIQUE: Both transabdominal and transvaginal ultrasound examinations were performed for complete evaluation of the gestation as well as the maternal uterus, adnexal regions, and pelvic cul-de-sac. Transvaginal technique was performed to assess early pregnancy. COMPARISON:  None. FINDINGS: Intrauterine gestational sac: None Subchorionic hemorrhage:  None visualized. Maternal uterus/adnexae: Normal uterus. Endometrium measures 19 mm. Normal right ovary. Left ovarian cyst measuring up to 2 cm, probably a corpus luteum. No significant free pelvic fluid. IMPRESSION: No intrauterine pregnancy identified. Normal adnexa. No significant pelvic free fluid. Electronically Signed   By: Mitzi Hansen M.D.   On: 12/12/2016 05:28   US Ob Transvaginal  Result Date: 12/12/2016 CLINICAL DATA:  41 y/o  F; 6 hours of vaginal bleeding. EXAM: OBSTETRIC <14 WK [place] AND TRANSVAGINAL OB US TECHNIQUE: Both transabdominal and transvaginal ultrasound examinations were performed for complete evaluation of the gestation as well as the maternal uterus, adnexal regions, and pelvic cul-de-sac. Transvaginal technique was performed to assess early pregnancy. COMPARISON:  None. FINDINGS: Intrauterine gestational sac: None Subchorionic hemorrhage:  None visualized. Maternal uterus/adnexae: Normal uterus. Endometrium measures 19 mm. Normal right ovary. Left ovarian cyst measuring up to 2 cm, probably a corpus luteum. No significant free pelvic fluid.  IMPRESSION: No intrauterine pregnancy identified. Normal adnexa. No significant pelvic free fluid. Electronically Signed   By: Mitzi Hansen M.D.   On: 12/12/2016 05:28  "    I personally reviewed the radiologic studies     ED Course:  Patient's stay here was uneventful and appears that she completed her miscarriage. Patient was advised of results and to continue with follow-up on Monday and have a repeat serum quantitative pregnancy to make sure that it decreases down to 0. Last return here if she has heavy vaginal bleeding as the course of her miscarriage to be completed by now.   Clinical Course      Assessment: ** Completed spontaneous abortion   Final Clinical Impression  Final diagnoses:  Miscarriage     Plan:  Outpatient Patient was advised to return immediately if condition worsens. Patient was advised to follow up with their  primary care physician or other specialized physicians involved in their outpatient care. The patient and/or family member/power of attorney had laboratory results reviewed at the bedside. All questions and concerns were addressed and appropriate discharge instructions were distributed by the nursing staff.             Jennye Moccasin, MD 12/12/16 [phone]

## 2016-12-12 NOTE — ED Notes (Signed)
Pt presents to ED with vaginal bleeding and lower abd cramping. Currently 11 weeks 6 days pregnant. Last seen by obgyn Thursday and ultrasound performed; fetus had not grown and had no heartbeat detected at that time. Pt reports no  Vaginal bleeding until last night around 2200. Pt reports having to use approx 14 "night time" pads since onset of bleeding with large number of clots and tissue passing as well. Denies any other symptoms at this time.

## 2016-12-13 ENCOUNTER — Other Ambulatory Visit: Payer: Medicaid Other

## 2016-12-14 ENCOUNTER — Ambulatory Visit (INDEPENDENT_AMBULATORY_CARE_PROVIDER_SITE_OTHER): Payer: Medicaid Other | Admitting: Obstetrics and Gynecology

## 2016-12-14 ENCOUNTER — Encounter: Payer: Medicaid Other | Admitting: Obstetrics and Gynecology

## 2016-12-14 ENCOUNTER — Encounter: Payer: Self-pay | Admitting: Obstetrics and Gynecology

## 2016-12-14 VITALS — BP 119/80 | HR 105 | Ht 64.0 in | Wt 256.0 lb

## 2016-12-14 DIAGNOSIS — O09529 Supervision of elderly multigravida, unspecified trimester: Secondary | ICD-10-CM

## 2016-12-14 DIAGNOSIS — O039 Complete or unspecified spontaneous abortion without complication: Secondary | ICD-10-CM | POA: Diagnosis not present

## 2016-12-14 DIAGNOSIS — Z716 Tobacco abuse counseling: Secondary | ICD-10-CM

## 2016-12-14 DIAGNOSIS — Z720 Tobacco use: Secondary | ICD-10-CM | POA: Insufficient documentation

## 2016-12-14 DIAGNOSIS — J45909 Unspecified asthma, uncomplicated: Secondary | ICD-10-CM | POA: Diagnosis not present

## 2016-12-14 NOTE — Patient Instructions (Signed)

## 2016-12-14 NOTE — Progress Notes (Signed)
GYNECOLOGY PROGRESS NOTE  Subjective:    Patient ID: Amy Beasley, female    DOB: 06/17/1976, 41 y.o.   MRN: [id number]  HPI  Patient is a 41 y.o. [phone] female who presents for f/u after miscarriage.  Patient was seen last week and noted to have a missed abortion at [redacted] weeks gestation (was supposed to be [redacted] weeks pregnant).  Decided to do expectant management.  Patient notes beginning on 12/12/15 she began to experience heavy bleeding like a periods.  Was concerned so she went into the ER that night as she noted she was changing an excessive amount of pads.  Notes passing tissue while in the waiting room . Ultrasound noted no IUP, apparent complete miscarriage. Currently notes bleeding has slowed down, and is not having any cramping.   The following portions of the patient's history were reviewed and updated as appropriate: allergies, current medications, past family history, past medical history, past social history, past surgical history and problem list.  Review of Systems Pertinent items noted in HPI and remainder of comprehensive ROS otherwise negative.   Objective:   Blood pressure 119/80, pulse (!) 105, height 5\' 4"  (1.626 m), weight 256 lb (116.1 kg), last menstrual period 09/13/2016. General appearance: alert and no distress Abdomen: soft, non-tender; bowel sounds normal; no masses,  no organomegaly Pelvic: deferred Extremities: extremities normal, atraumatic, no cyanosis or edema Neurologic: Grossly normal   Labs:  Results for orders placed or performed during the hospital encounter of 12/12/16  hCG, quantitative, pregnancy  Result Value Ref Range   hCG, Beta Chain, Quant, S 2,355 (H) <5 mIU/mL  CBC with Differential  Result Value Ref Range   WBC 8.7 3.6 - 11.0 K/uL   RBC 4.79 3.80 - 5.20 MIL/uL   Hemoglobin 14.4 12.0 - 16.0 g/dL   HCT 14.742.8 82.935.0 - 56.247.0 %   MCV 89.2 80.0 - 100.0 fL   MCH 30.1 26.0 - 34.0 pg   MCHC 33.8 32.0 - 36.0 g/dL   RDW 13.014.1 86.511.5 - 78.414.5 %   Platelets 269 150 - 440 K/uL   Neutrophils Relative % 77 %   Neutro Abs 6.7 (H) 1.4 - 6.5 K/uL   Lymphocytes Relative 13 %   Lymphs Abs 1.1 1.0 - 3.6 K/uL   Monocytes Relative 4 %   Monocytes Absolute 0.4 0.2 - 0.9 K/uL   Eosinophils Relative 3 %   Eosinophils Absolute 0.3 0 - 0.7 K/uL   Basophils Relative 3 %   Basophils Absolute 0.2 (H) 0 - 0.1 K/uL  Basic metabolic panel  Result Value Ref Range   Sodium 135 135 - 145 mmol/L   Potassium 4.0 3.5 - 5.1 mmol/L   Chloride 100 (L) 101 - 111 mmol/L   CO2 29 22 - 32 mmol/L   Glucose, Bld 111 (H) 65 - 99 mg/dL   BUN 6 6 - 20 mg/dL   Creatinine, Ser 6.960.66 0.44 - 1.00 mg/dL   Calcium 9.4 8.9 - 29.510.3 mg/dL   GFR calc non Af Amer >60 >60 mL/min   GFR calc Af Amer >60 >60 mL/min   Anion gap 6 5 - 15  Type and screen [facility]  Result Value Ref Range   ABO/RH(D) A POS    Antibody Screen NEG    Sample Expiration 12/15/2016      Imaging (12/12/2016):  CLINICAL DATA:  41 y/o  F; 6 hours of vaginal bleeding.  EXAM: OBSTETRIC <14 WK US AND TRANSVAGINAL OB  US  TECHNIQUE: Both transabdominal and transvaginal ultrasound examinations were performed for complete evaluation of the gestation as well as the maternal uterus, adnexal regions, and pelvic cul-de-sac. Transvaginal technique was performed to assess early pregnancy.  COMPARISON:  None.  FINDINGS: Intrauterine gestational sac: None  Subchorionic hemorrhage:  None visualized.  Maternal uterus/adnexae: Normal uterus. Endometrium measures 19 mm. Normal right ovary. Left ovarian cyst measuring up to 2 cm, probably a corpus luteum. No significant free pelvic fluid.  IMPRESSION: No intrauterine pregnancy identified. Normal adnexa. No significant pelvic free fluid.   Assessment:   S/p complete miscarriage.  Advanced maternal age Obesity Tobacco abuse  Plan:   Patient now s/p completion of miscarriage (previously missed Ab).  Advised that bleeding  may continue like a period for several more days but should decrease each day.  Given handout on support groups for miscarriage.  Patient desires to discuss future fertility. Notes that she and her husband would like to try again.  Discussed that based on age, she shold try as soon as possible, after next normal menses. Patient reports that she usually does not have regular menses. Discussed possible use of Provera to induce normal cycles, advised on timed coitus, and possible use of Clomid.  Discussed that losing at least 10% of her current body weight could also help improve chances of future fertility and childbearing.  Reiterated smoking cessation. Advised that smoking can also interfere with fertility.  Patient has not yet gotten started on Buproprion, notes she still has prescription, will get it filled.  Patient to f/u in 1 month, if menses has not returned regular. To return in 1 week for BHCG recheck.     Hildred Laser, MD [facility]

## 2016-12-20 ENCOUNTER — Other Ambulatory Visit: Payer: Medicaid Other

## 2016-12-20 DIAGNOSIS — O021 Missed abortion: Secondary | ICD-10-CM

## 2016-12-21 LAB — BETA HCG QUANT (REF LAB): HCG QUANT: 11 m[IU]/mL

## 2017-03-21 ENCOUNTER — Other Ambulatory Visit: Payer: Self-pay

## 2017-03-21 DIAGNOSIS — J45909 Unspecified asthma, uncomplicated: Secondary | ICD-10-CM

## 2017-03-21 MED ORDER — ALBUTEROL SULFATE HFA 108 (90 BASE) MCG/ACT IN AERS: 2.0000 | INHALATION_SPRAY | Freq: Four times a day (QID) | RESPIRATORY_TRACT | 0 refills | Status: DC | PRN

## 2017-05-23 ENCOUNTER — Encounter: Payer: Self-pay | Admitting: Obstetrics and Gynecology

## 2017-05-26 ENCOUNTER — Ambulatory Visit (INDEPENDENT_AMBULATORY_CARE_PROVIDER_SITE_OTHER): Payer: Medicaid Other | Admitting: Obstetrics and Gynecology

## 2017-05-26 ENCOUNTER — Encounter: Payer: Self-pay | Admitting: Obstetrics and Gynecology

## 2017-05-26 VITALS — BP 132/86 | HR 103 | Ht 64.0 in | Wt 250.2 lb

## 2017-05-26 DIAGNOSIS — O09291 Supervision of pregnancy with other poor reproductive or obstetric history, first trimester: Secondary | ICD-10-CM

## 2017-05-26 DIAGNOSIS — Z3201 Encounter for pregnancy test, result positive: Secondary | ICD-10-CM | POA: Diagnosis not present

## 2017-05-26 DIAGNOSIS — O09521 Supervision of elderly multigravida, first trimester: Secondary | ICD-10-CM

## 2017-05-26 DIAGNOSIS — N96 Recurrent pregnancy loss: Secondary | ICD-10-CM | POA: Insufficient documentation

## 2017-05-26 LAB — POCT URINE PREGNANCY: PREG TEST UR: POSITIVE — AB

## 2017-05-26 NOTE — Progress Notes (Signed)
   GYNECOLOGY CLINIC PROGRESS NOTE Subjective:    Amy Beasley is a 41 y.o. [phone] female who presents for evaluation of amenorrhea. She believes she could be pregnant. Pregnancy is desired. Sexual Activity: single partner, contraception: none. Current symptoms also include: breast tenderness, fatigue, nausea and positive home pregnancy test (several tests positive). Last period was normal.  Patient's last menstrual period was 04/18/2017 (exact date).   Of note, patient is on several different OTC supplements, including coconut oil, Tumaric, Bee pollen, aloe vera gel, and fiber.  In addition, patient was taking Phentermine starting in May, but discontinued at discovery of pregnancy.   The following portions of the patient's history were reviewed and updated as appropriate: allergies, current medications, past family history, past medical history, past social history, past surgical history and problem list.   Review of Systems Pertinent items noted in HPI and remainder of comprehensive ROS otherwise negative.     Objective:    BP 132/86   Pulse (!) 103   Ht 5\' 4"  (1.626 m)   Wt 250 lb 4 oz (113.5 kg)   LMP 04/18/2017 (Exact Date)   BMI 42.96 kg/m  General: alert, no distress and no acute distress    Lab Review Urine HCG: positive    Assessment:    Absence of menstruation with positive pregnancy test  Advanced maternal age  H/o miscarriage.   Obesity (morbid)   Plan:   - Pregnancy Test: Positive: EDC: 01/23/2017, with EGA 5.3 weeks. Briefly discussed pre-natal care options. Pregnancy, Childbirth and the Newborn book given. Encouraged well-balanced diet, plenty of rest when needed, pre-natal vitamins daily and walking for exercise. Discussed self-help for nausea, avoiding OTC medications until consulting provider or pharmacist, other than Tylenol as needed, minimal caffeine (1-2 cups daily) and avoiding alcohol. She will schedule her initial OB visit in the next month with her OB  provider.  - Discussed used of supplements, advised to discontinue Tumaric at this time as it has been associated with increasing uterine contractions. All other supplements ok.  - Will have patient schedule viability scan in 1-2 weeks in light of history of obesity, AMA status, and h/o recent miscarriage (occurred in January).   Hildred Laserherry, Latasha Buczkowski, MD [facility]

## 2017-05-26 NOTE — Progress Notes (Signed)
Pt is here for a confirmation of pregnancy. 2 pos home pregnancy tests. C/o breast tenderness, nausea

## 2017-05-31 ENCOUNTER — Encounter: Payer: Self-pay | Admitting: Obstetrics and Gynecology

## 2017-05-31 ENCOUNTER — Other Ambulatory Visit: Payer: Medicaid Other

## 2017-05-31 ENCOUNTER — Other Ambulatory Visit: Payer: Self-pay

## 2017-05-31 DIAGNOSIS — O09291 Supervision of pregnancy with other poor reproductive or obstetric history, first trimester: Secondary | ICD-10-CM

## 2017-06-01 ENCOUNTER — Encounter: Payer: Self-pay | Admitting: Obstetrics and Gynecology

## 2017-06-01 LAB — BETA HCG QUANT (REF LAB): hCG Quant: 10 m[IU]/mL

## 2017-06-02 ENCOUNTER — Telehealth: Payer: Self-pay

## 2017-06-02 ENCOUNTER — Other Ambulatory Visit: Payer: Medicaid Other

## 2017-06-02 NOTE — Telephone Encounter (Signed)
Called pt informed her of information below. Advised pt to keep f/u appt.

## 2017-06-02 NOTE — Telephone Encounter (Signed)
-----   Message from Hildred LaserAnika Cherry, MD sent at 06/02/2017  2:03 PM EDT ----- Patient's BHCG is very low, most likely a miscarriage.  From the reading of the mychart messages, I think the patient was supposed to come in today(?) for a repeat BHCG.  We can wait until this comes back to give her the news, but of course we won't be in the office tomorrow, so I think it may be best to inform her even before the repeat.

## 2017-06-03 LAB — BETA HCG QUANT (REF LAB): hCG Quant: 2 m[IU]/mL

## 2017-06-07 ENCOUNTER — Encounter: Payer: Self-pay | Admitting: Obstetrics and Gynecology

## 2017-06-07 ENCOUNTER — Ambulatory Visit (INDEPENDENT_AMBULATORY_CARE_PROVIDER_SITE_OTHER): Payer: Medicaid Other | Admitting: Obstetrics and Gynecology

## 2017-06-07 VITALS — BP 116/73 | HR 98 | Ht 64.0 in | Wt 254.9 lb

## 2017-06-07 DIAGNOSIS — N96 Recurrent pregnancy loss: Secondary | ICD-10-CM

## 2017-06-07 DIAGNOSIS — Z720 Tobacco use: Secondary | ICD-10-CM | POA: Diagnosis not present

## 2017-06-07 NOTE — Progress Notes (Signed)
    GYNECOLOGY PROGRESS NOTE  Subjective:    Patient ID: Milinda CaveKimberly Haynes, female    DOB: 09/10/1976, 41 y.o.   MRN: [id number]  HPI  Patient is a 41 y.o. [id number] female who presents for f/u after miscarriage (had chemical pregnancy, with miscarriage last week).  Patient today presents for discussion of future fertility and current desires. Cala BradfordKimberly states that pregnancy is desired, however would also like to continue her phentermine for weight loss management. Also desires to know if there is "something wrong with her" as to why she has suffered 2 miscarriages in the past several months.   The following portions of the patient's history were reviewed and updated as appropriate: allergies, current medications, past family history, past medical history, past social history, past surgical history and problem list.  Review of Systems Pertinent items noted in HPI and remainder of comprehensive ROS otherwise negative.   Objective:   Blood pressure 116/73, pulse 98, height 5\' 4"  (1.626 m), weight 254 lb 14.4 oz (115.6 kg), last menstrual period 04/18/2017. General appearance: alert and no distress Exam deferred  Assessment:   H/o early pregnancy losses Advanced maternal age Obesity Tobacco abuse  Plan:   1.  Discussed current risk factors with patient.  Advised that due to h/o recurrent losses (x2) over the past 6 months, if she were planning to become pregnant again, then waiting approximately 3 months. In that time, she can reduce her risk factor of obesity by resuming the Phentermine to encourage weight loss.  She should also begin taking a daily baby aspirin (81 mg) daily as well as additional folic acid (up to 1 mg daily).  Lastly, as patient has been able to become pregnant but loses prior to 8 weeks, would recommend initiation of supplemental progesterone as soon as discovery of pregnancy occurs.  If miscarriage occurs again, patient may require further workup.  Reiterated smoking  cessation.  2. To f/u as needed   A total of 15 minutes were spent face-to-face with the patient during this encounter and over half of that time dealt with counseling and coordination of care.   Hildred Laserherry, Darlin Stenseth, MD [facility]

## 2017-06-07 NOTE — Patient Instructions (Signed)
Begin taking a daily baby aspirin (81 mg) Begin taking extra folic acid (1 mg daily) Call once pregnancy is established to begin progesterone supplements

## 2017-06-10 ENCOUNTER — Other Ambulatory Visit: Payer: Medicaid Other

## 2017-06-15 ENCOUNTER — Emergency Department
Admission: EM | Admit: 2017-06-15 | Discharge: 2017-06-16 | Disposition: A | Discharge status: Home / Self Care | Payer: Medicaid Other | Attending: Emergency Medicine | Admitting: Emergency Medicine

## 2017-06-15 ENCOUNTER — Encounter: Payer: Self-pay | Admitting: *Deleted

## 2017-06-15 DIAGNOSIS — R21 Rash and other nonspecific skin eruption: Secondary | ICD-10-CM | POA: Insufficient documentation

## 2017-06-15 DIAGNOSIS — J45909 Unspecified asthma, uncomplicated: Secondary | ICD-10-CM | POA: Insufficient documentation

## 2017-06-15 DIAGNOSIS — Z79899 Other long term (current) drug therapy: Secondary | ICD-10-CM | POA: Insufficient documentation

## 2017-06-15 DIAGNOSIS — F17210 Nicotine dependence, cigarettes, uncomplicated: Secondary | ICD-10-CM | POA: Diagnosis not present

## 2017-06-15 LAB — COMPREHENSIVE METABOLIC PANEL
ALBUMIN: 3.6 g/dL (ref 3.5–5.0)
ALT: 25 U/L (ref 14–54)
ANION GAP: 7 (ref 5–15)
AST: 21 U/L (ref 15–41)
Alkaline Phosphatase: 58 U/L (ref 38–126)
BUN: 11 mg/dL (ref 6–20)
CHLORIDE: 103 mmol/L (ref 101–111)
CO2: 30 mmol/L (ref 22–32)
Calcium: 9.1 mg/dL (ref 8.9–10.3)
Creatinine, Ser: 0.85 mg/dL (ref 0.44–1.00)
GFR calc Af Amer: >60 mL/min (ref >60)
Glucose, Bld: 101 mg/dL — ABNORMAL HIGH (ref 65–99)
POTASSIUM: 4.3 mmol/L (ref 3.5–5.1)
Sodium: 140 mmol/L (ref 135–145)
Total Bilirubin: 0.8 mg/dL (ref 0.3–1.2)
Total Protein: 6.9 g/dL (ref 6.5–8.1)

## 2017-06-15 LAB — CBC
HEMATOCRIT: 42.2 % (ref 35.0–47.0)
Hemoglobin: 14.3 g/dL (ref 12.0–16.0)
MCH: 30.7 pg (ref 26.0–34.0)
MCHC: 33.9 g/dL (ref 32.0–36.0)
MCV: 90.5 fL (ref 80.0–100.0)
Platelets: 264 10*3/uL (ref 150–440)
RBC: 4.66 MIL/uL (ref 3.80–5.20)
RDW: 14.2 % (ref 11.5–14.5)
WBC: 7.3 10*3/uL (ref 3.6–11.0)

## 2017-06-15 MED ORDER — DOXYCYCLINE HYCLATE 100 MG PO TABS
100.0000 mg | ORAL_TABLET | Freq: Once | ORAL | Status: AC
  Administered 2017-06-15: 100 mg via ORAL
  Filled 2017-06-15: qty 1

## 2017-06-15 MED ORDER — DOXYCYCLINE HYCLATE 100 MG PO TABS: 100.0000 mg | ORAL_TABLET | Freq: Two times a day (BID) | ORAL | 0 refills | Status: AC

## 2017-06-15 NOTE — ED Triage Notes (Signed)
Pt has itching rash to both legs.  Pt reports itching  Rash began yesterday.  Worse today.

## 2017-06-15 NOTE — Discharge Instructions (Signed)
Today you're seen in the emergency department for a skin rash. The rash and symptoms are concerning for possible tick disease. Labs were drawn to check for [facility].Please take doxycycline as prescribed. Return to the emergency department for any fevers, abdominal pain, headache, neck pain, worsening rash, worsening symptoms or urgent changes in her health.

## 2017-06-15 NOTE — ED Provider Notes (Signed)
[facility]-EMERGENCY DEPARTMENT Provider Note   CSN: [id number] Arrival date & time: 06/15/17  2054     History   Chief Complaint Chief Complaint  Patient presents with  . Rash    HPI Amy Beasley is a 41 y.o. female presents to the emergency department for evaluation of rash on the lower extremities. Patient has had a rash present along the lower extremities for 3 weeks. 3 weeks ago started out as a single macular erythematous rash on the left leg but patient thought was a spider bite. Amy Beasley denies seeing or feeling any type of spider or tick bite. [place] Single erythematous macular rash has resolved and now Amy Beasley has developed a petechial type rash that has been spreading from the ankles up to the knees. Amy Beasley is also had mild petechial rash on the upper extremities along the forearms. No rash along Amy Beasley trunk. Patient has also had macular erythematous lesions that are indurated coming and going along the left and right lower extremities. Patient states Amy Beasley's had mild body aches with occasional headache and chills. Amy Beasley denies any nausea vomiting or diarrhea. No neck pain. Amy Beasley has been taken occasional over-the-counter medications as needed for pain which has given Amy Beasley relief. No facial swelling, chest pain, shortness of breath. No new medications .    HPI  Past Medical History:  Diagnosis Date  . Amenorrhea   . Irregular menses     Patient Active Problem List   Diagnosis Date Noted  . 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 Morbid obesity (HCC) 12/14/2016  . Tobacco abuse 12/14/2016  . AMA (advanced maternal age) multigravida 35+ 12/14/2016  . Asthma due to environmental allergies 12/14/2016    Past Surgical History:  Procedure Laterality Date  . none      OB History    Gravida Para Term Preterm AB Living   5 3 3   1 3    SAB TAB Ectopic Multiple Live Births   1       3       Home Medications    Prior to Admission medications   Medication Sig  Start Date End Date Taking? Authorizing Provider  albuterol (PROVENTIL HFA;VENTOLIN HFA) 108 (90 Base) MCG/ACT inhaler Inhale 2 puffs into the lungs every 6 (six) hours as needed for wheezing or shortness of breath. 03/21/17   Hildred Laserherry, Anika, MD  cetirizine (ZYRTEC) 10 MG tablet Take 10 mg by mouth daily.    [provider]  doxycycline (VIBRA-TABS) 100 MG tablet Take 1 tablet (100 mg total) by mouth 2 (two) times daily. 06/15/17 06/29/17  Evon SlackGaines, Ayad Nieman C, PA-C  omeprazole (PRILOSEC) 20 MG capsule Take 20 mg by mouth daily.    [provider]  Prenatal Vit-Fe Fumarate-FA (PRENATAL MULTIVITAMIN) TABS tablet Take 1 tablet by mouth daily at 12 noon.    [provider]    Family History Family History  Problem Relation Age of Onset  . Asthma Sister   . Cancer Maternal Grandmother        breast  . Diabetes Maternal Grandmother        type 2  . Hyperlipidemia Maternal Grandmother   . Cancer Maternal Grandfather        prostate cancer  . Hypertension Maternal Grandfather   . Eczema Child     Social History Social History  Substance Use Topics  . Smoking status: Current Every Day Smoker    Packs/day: 0.50  . Smokeless tobacco: Never Used  .  Alcohol use No     Allergies   Patient has no known allergies.   Review of Systems Review of Systems  Constitutional: Positive for chills. Negative for activity change, fatigue and fever.  HENT: Negative for congestion, sinus pressure and sore throat.   Eyes: Negative for visual disturbance.  Respiratory: Negative for cough, chest tightness and shortness of breath.   Cardiovascular: Negative for chest pain and leg swelling.  Gastrointestinal: Negative for abdominal pain, diarrhea, nausea and vomiting.  Genitourinary: Negative for dysuria.  Musculoskeletal: Positive for myalgias. Negative for arthralgias and gait problem.  Skin: Positive for rash.  Neurological: Positive for headaches. Negative for weakness and  numbness.  Hematological: Negative for adenopathy.  Psychiatric/Behavioral: Negative for agitation, behavioral problems and confusion.     Physical Exam Updated Vital Signs BP (!) 116/57 (BP Location: Right Arm)   Pulse 88   Temp 98.2 F (36.8 C) (Oral)   Resp 18   Ht 5\' 4"  (1.626 m)   Wt 113.4 kg (250 lb)   LMP 05/31/2017 (Approximate)   SpO2 97%   Breastfeeding? Unknown   BMI 42.91 kg/m   Physical Exam  Constitutional: Amy Beasley is oriented to person, place, and time. Amy Beasley appears well-developed and well-nourished.  HENT:  Head: Normocephalic and atraumatic.  Right Ear: External ear normal.  Left Ear: External ear normal.  Mouth/Throat: No oropharyngeal exudate.  Eyes: Conjunctivae and EOM are normal.  Neck: Normal range of motion.  Cardiovascular: Normal rate and intact distal pulses.   Pulmonary/Chest: Effort normal. No respiratory distress.  Abdominal: Soft.  Musculoskeletal: Normal range of motion. Amy Beasley exhibits no edema.  Lymphadenopathy:    Amy Beasley has no cervical adenopathy.  Neurological: Amy Beasley is alert and oriented to person, place, and time.  Skin: Rash noted.  Patient has erythematous macular lesion along the right posterior thigh, mild erythematous macular lesion on the left lower extremity, tib-fib region. Amy Beasley has petechial lesions on bilateral lower extremities along the tib-fib region with very mild petechial type rash forearms bilaterally. No rash throughout the trunk or face. No signs of facial swelling.  Psychiatric: Amy Beasley has a normal mood and affect. Amy Beasley behavior is normal. Judgment and thought content normal.     ED Treatments / Results  Labs (all labs ordered are listed, but only abnormal results are displayed) Labs Reviewed  COMPREHENSIVE METABOLIC PANEL - Abnormal; Notable for the following:       Result Value   Glucose, Bld 101 (*)    All other components within normal limits  CBC  ROCKY MTN SPOTTED FVR ABS PNL(IGG+IGM)    EKG  EKG  Interpretation None       Radiology No results found.  Procedures Procedures (including critical care time)  Medications Ordered in ED Medications  doxycycline (VIBRA-TABS) tablet 100 mg (100 mg Oral Given 06/15/17 2309)     Initial Impression / Assessment and Plan / ED Course  I have reviewed the triage vital signs and the nursing notes.  Pertinent labs & imaging results that were available during my care of the patient were reviewed by me and considered in my medical decision making (see chart for details).     41 year old female with rash on the lower extremities. Patient's history of macular erythematous rash turning into petechial rash on both the upper and lower extremities along with body aches, headache and chills concerning for [facility] spotted fever. RMSF labs are ordered and patient started on doxycycline. Amy Beasley is educated on signs and symptoms to return  to the ED for.  Final Clinical Impressions(s) / ED Diagnoses   Final diagnoses:  Rash    New Prescriptions New Prescriptions   DOXYCYCLINE (VIBRA-TABS) 100 MG TABLET    Take 1 tablet (100 mg total) by mouth 2 (two) times daily.     Evon Slack, PA-C 06/16/17 0009    Sharman Cheek, MD 06/21/17 [phone]

## 2017-06-15 NOTE — ED Notes (Signed)
Pt reports rash to lower extremities reports pain to left foot bruising noted tender to touch. Pt reports feeling tired and with rash (petachie) for past 3 weeks

## 2017-06-17 LAB — ROCKY MTN SPOTTED FVR ABS PNL(IGG+IGM)
RMSF IgG: NEGATIVE
RMSF IgM: 0.26 index (ref 0.00–0.89)

## 2018-01-29 IMAGING — US US OB COMP LESS 14 WK
1 series · 14 of 28 positions shown · non-contrast
Comparison: None.

CLINICAL DATA: 40 y/o  F; 6 hours of vaginal bleeding.

EXAM:
OBSTETRIC <14 WK US AND TRANSVAGINAL OB US
TECHNIQUE: Both transabdominal and transvaginal ultrasound examinations were
performed for complete evaluation of the gestation as well as the
maternal uterus, adnexal regions, and pelvic cul-de-sac.
Transvaginal technique was performed to assess early pregnancy.

[Series 1: us ob comp less 14 wk · 0.26mm/px · 14 of 88 slices shown]
[im 4/88]
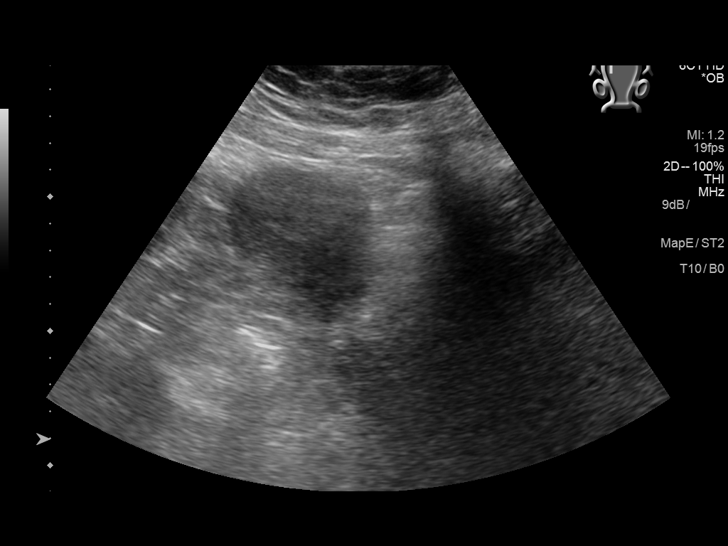
[im 10/88]
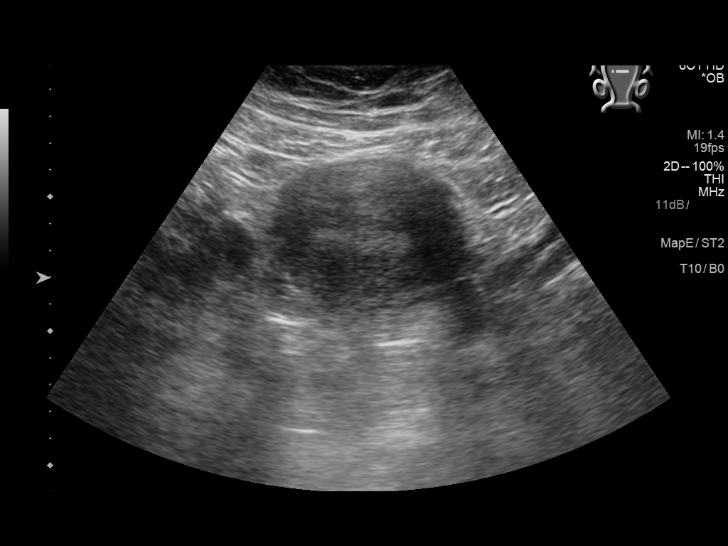
[im 17/88]
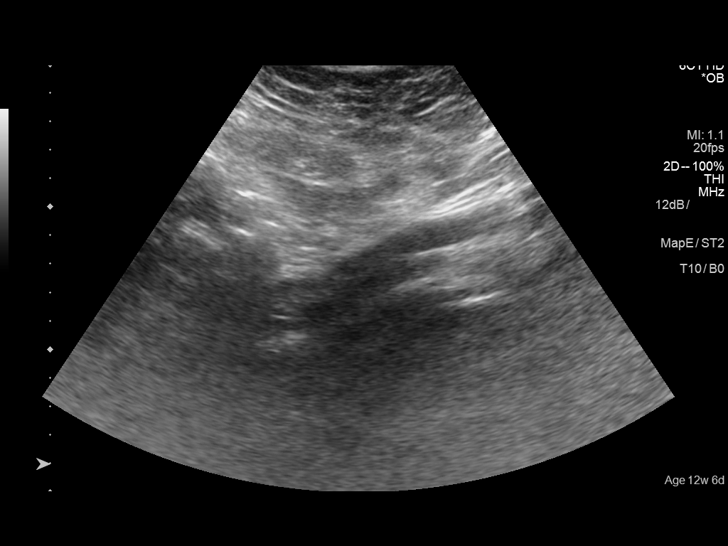
[im 23/88]
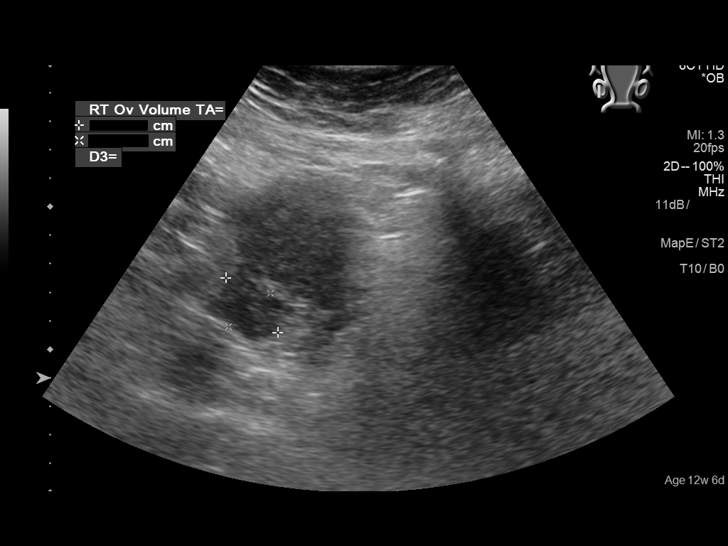
[im 30/88]
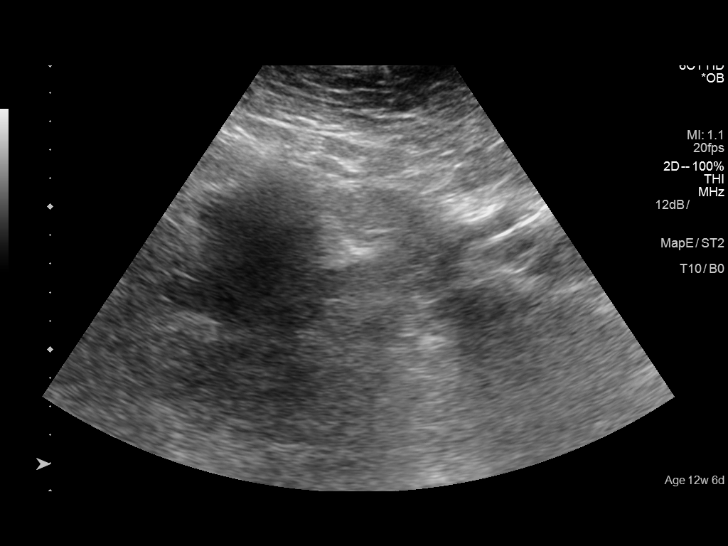
[im 36/88]
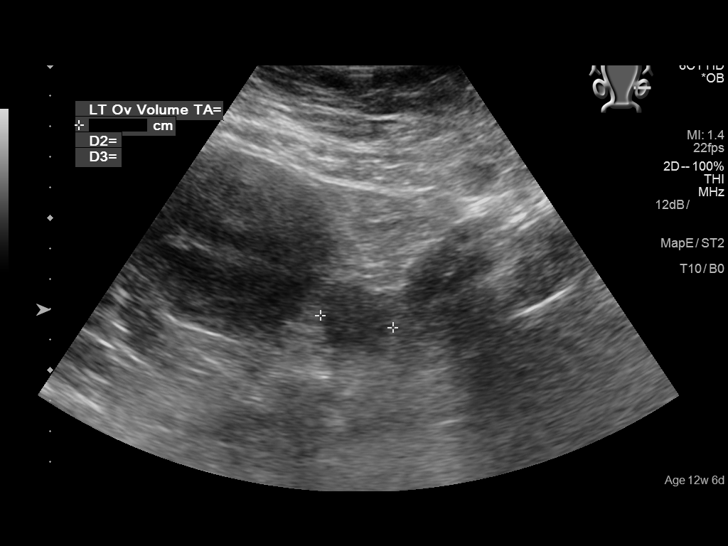
[im 42/88]
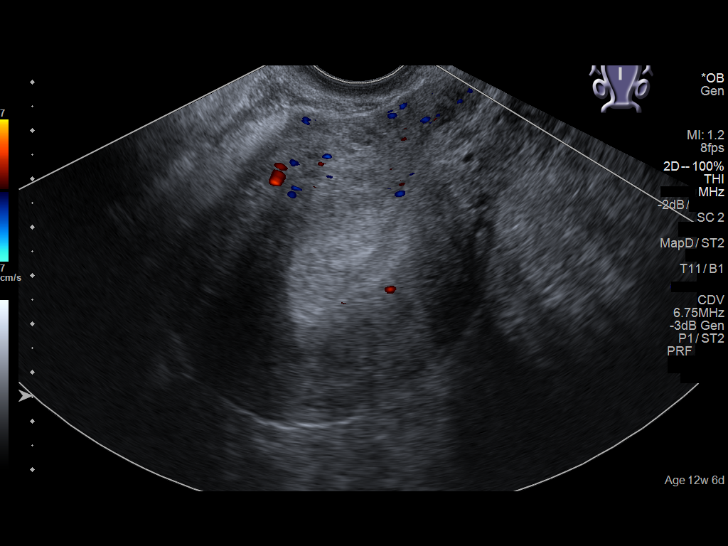
[im 49/88]
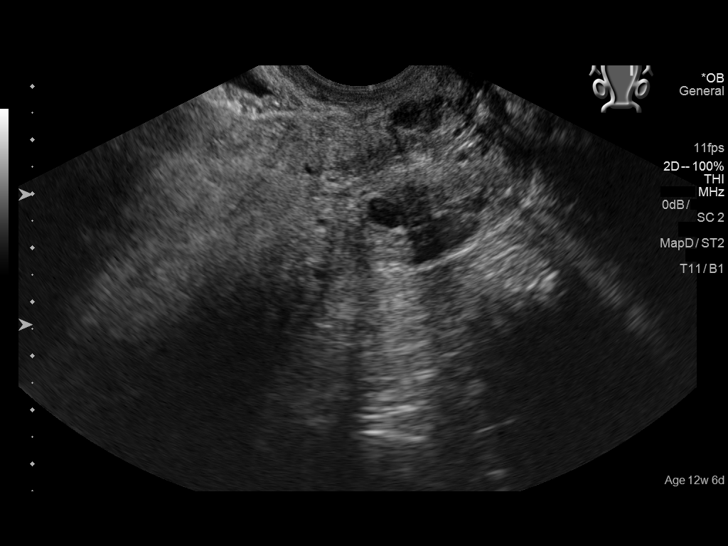
[im 55/88]
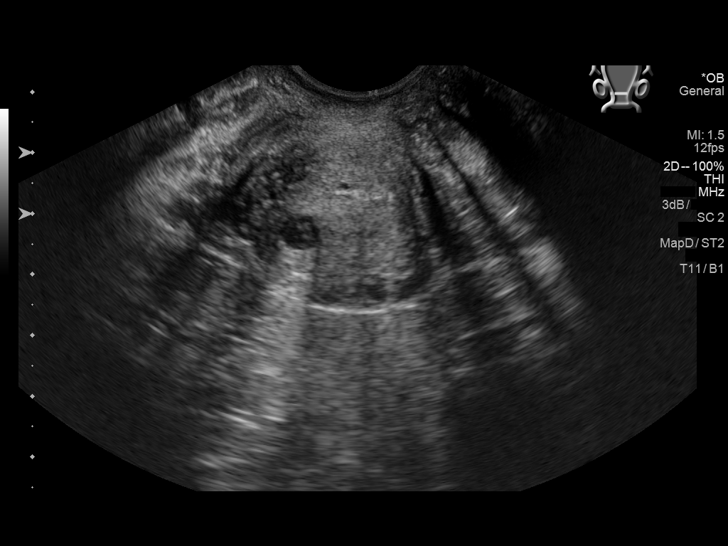
[im 62/88]
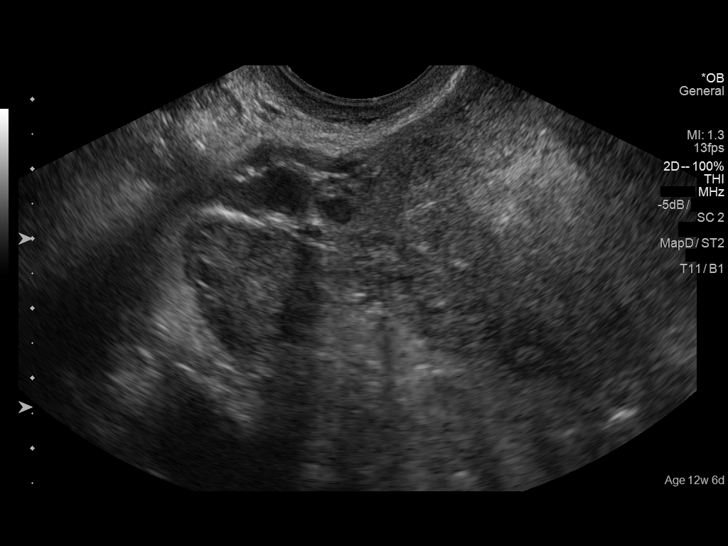
[im 68/88]
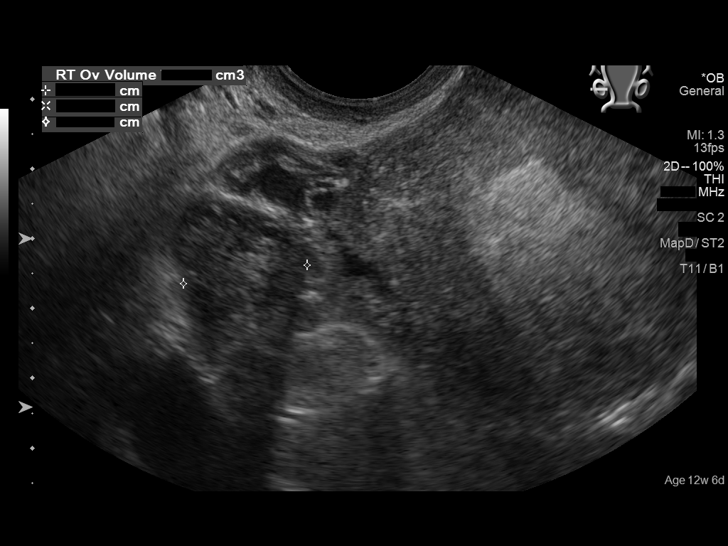
[im 75/88]
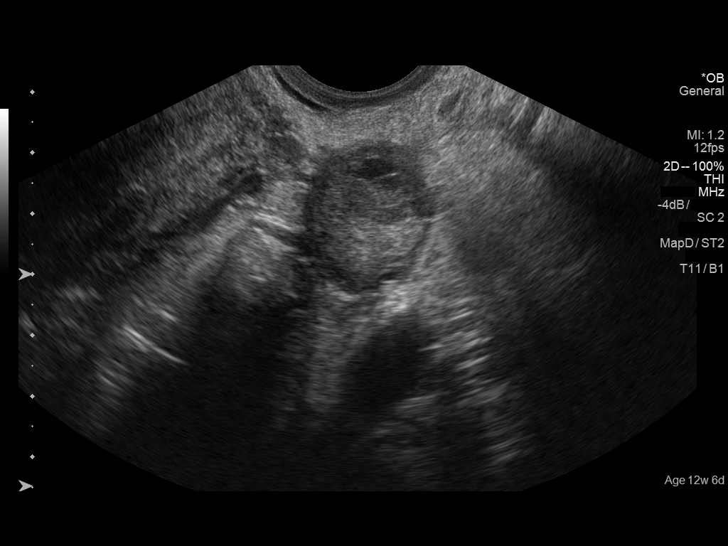
[im 81/88]
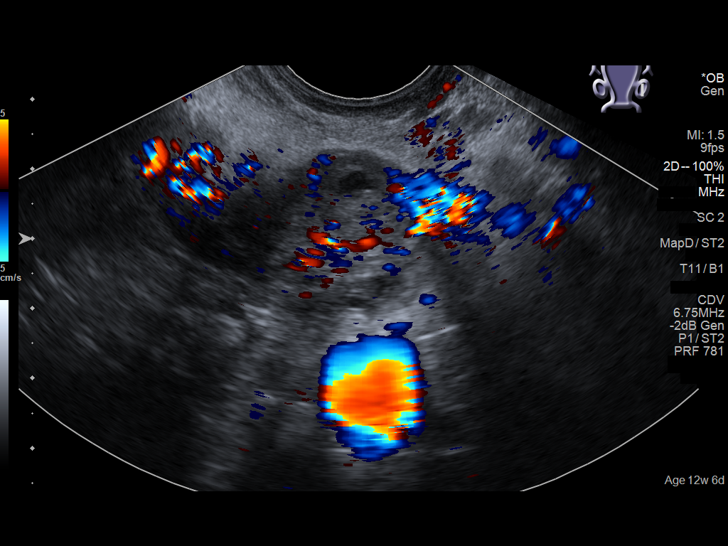
[im 88/88]
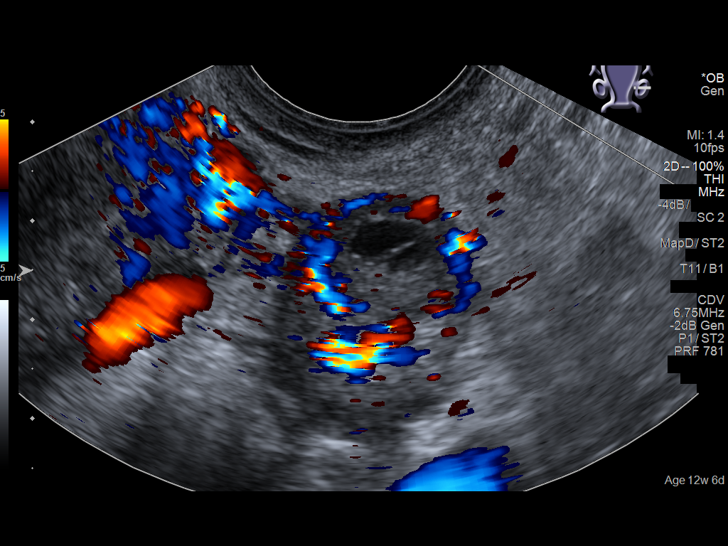

[14 of 28 positions shown; findings below may reference images not displayed]

FINDINGS: Intrauterine gestational sac: None

Subchorionic hemorrhage:  None visualized.

Maternal uterus/adnexae: Normal uterus. Endometrium measures 19 mm.
Normal right ovary. Left ovarian cyst measuring up to 2 cm, probably
a corpus luteum. No significant free pelvic fluid.
IMPRESSION: No intrauterine pregnancy identified. Normal adnexa. No significant
pelvic free fluid.

By: Elias Eliseo Toussaint M.D.

## 2021-04-11 ENCOUNTER — Emergency Department: Payer: Self-pay

## 2021-04-11 ENCOUNTER — Emergency Department
Admission: EM | Admit: 2021-04-11 | Discharge: 2021-04-11 | Disposition: A | Discharge status: Home / Self Care | Payer: Self-pay | Attending: Emergency Medicine | Admitting: Emergency Medicine

## 2021-04-11 ENCOUNTER — Other Ambulatory Visit: Payer: Self-pay

## 2021-04-11 ENCOUNTER — Encounter: Payer: Self-pay | Admitting: Intensive Care

## 2021-04-11 DIAGNOSIS — Z20822 Contact with and (suspected) exposure to covid-19: Secondary | ICD-10-CM | POA: Insufficient documentation

## 2021-04-11 DIAGNOSIS — J189 Pneumonia, unspecified organism: Secondary | ICD-10-CM

## 2021-04-11 DIAGNOSIS — J45909 Unspecified asthma, uncomplicated: Secondary | ICD-10-CM | POA: Insufficient documentation

## 2021-04-11 DIAGNOSIS — F17210 Nicotine dependence, cigarettes, uncomplicated: Secondary | ICD-10-CM | POA: Insufficient documentation

## 2021-04-11 DIAGNOSIS — J181 Lobar pneumonia, unspecified organism: Secondary | ICD-10-CM | POA: Insufficient documentation

## 2021-04-11 LAB — COMPREHENSIVE METABOLIC PANEL
ALT: 22 U/L (ref 0–44)
AST: 23 U/L (ref 15–41)
Albumin: 3.4 g/dL — ABNORMAL LOW (ref 3.5–5.0)
Alkaline Phosphatase: 59 U/L (ref 38–126)
Anion gap: 11 (ref 5–15)
BUN: 8 mg/dL (ref 6–20)
CO2: 26 mmol/L (ref 22–32)
Calcium: 8.7 mg/dL — ABNORMAL LOW (ref 8.9–10.3)
Chloride: 98 mmol/L (ref 98–111)
Creatinine, Ser: 0.63 mg/dL (ref 0.44–1.00)
GFR, Estimated: >60 mL/min (ref >60)
Glucose, Bld: 105 mg/dL — ABNORMAL HIGH (ref 70–99)
Potassium: 3.6 mmol/L (ref 3.5–5.1)
Sodium: 135 mmol/L (ref 135–145)
Total Bilirubin: 0.8 mg/dL (ref 0.3–1.2)
Total Protein: 7.4 g/dL (ref 6.5–8.1)

## 2021-04-11 LAB — CBC WITH DIFFERENTIAL/PLATELET
Abs Immature Granulocytes: 0.09 10*3/uL — ABNORMAL HIGH (ref 0.00–0.07)
Basophils Absolute: 0.1 10*3/uL (ref 0.0–0.1)
Basophils Relative: 1 %
Eosinophils Absolute: 0.2 10*3/uL (ref 0.0–0.5)
Eosinophils Relative: 1 %
HCT: 40.5 % (ref 36.0–46.0)
Hemoglobin: 12.7 g/dL (ref 12.0–15.0)
Immature Granulocytes: 1 %
Lymphocytes Relative: 13 %
Lymphs Abs: 1.7 10*3/uL (ref 0.7–4.0)
MCH: 27.4 pg (ref 26.0–34.0)
MCHC: 31.4 g/dL (ref 30.0–36.0)
MCV: 87.5 fL (ref 80.0–100.0)
Monocytes Absolute: 1 10*3/uL (ref 0.1–1.0)
Monocytes Relative: 8 %
Neutro Abs: 9.7 10*3/uL — ABNORMAL HIGH (ref 1.7–7.7)
Neutrophils Relative %: 76 %
Platelets: 273 10*3/uL (ref 150–400)
RBC: 4.63 MIL/uL (ref 3.87–5.11)
RDW: 14.3 % (ref 11.5–15.5)
WBC: 12.7 10*3/uL — ABNORMAL HIGH (ref 4.0–10.5)
nRBC: 0 % (ref 0.0–0.2)

## 2021-04-11 LAB — POC URINE PREG, ED: Preg Test, Ur: NEGATIVE

## 2021-04-11 MED ORDER — PSEUDOEPH-BROMPHEN-DM 30-2-10 MG/5ML PO SYRP: 10.0000 mL | ORAL_SOLUTION | Freq: Four times a day (QID) | ORAL | 0 refills | Status: AC | PRN

## 2021-04-11 MED ORDER — ALBUTEROL SULFATE (2.5 MG/3ML) 0.083% IN NEBU
2.5000 mg | INHALATION_SOLUTION | Freq: Once | RESPIRATORY_TRACT | Status: AC
  Administered 2021-04-11: 2.5 mg via RESPIRATORY_TRACT
  Filled 2021-04-11: qty 3

## 2021-04-11 MED ORDER — SODIUM CHLORIDE 0.9 % IV SOLN
1.0000 g | Freq: Once | INTRAVENOUS | Status: AC
  Administered 2021-04-11: 1 g via INTRAVENOUS
  Filled 2021-04-11: qty 10

## 2021-04-11 MED ORDER — BENZONATATE 100 MG PO CAPS: 100.0000 mg | ORAL_CAPSULE | Freq: Four times a day (QID) | ORAL | 0 refills | Status: AC | PRN

## 2021-04-11 MED ORDER — AZITHROMYCIN 250 MG PO TABS: ORAL_TABLET | ORAL | 0 refills | Status: AC

## 2021-04-11 MED ORDER — AZITHROMYCIN 500 MG PO TABS
1000.0000 mg | ORAL_TABLET | Freq: Once | ORAL | Status: AC
  Administered 2021-04-11: 1000 mg via ORAL
  Filled 2021-04-11: qty 2

## 2021-04-11 MED ORDER — ALBUTEROL SULFATE HFA 108 (90 BASE) MCG/ACT IN AERS: 2.0000 | INHALATION_SPRAY | RESPIRATORY_TRACT | 0 refills | Status: AC | PRN

## 2021-04-11 MED ORDER — IOHEXOL 300 MG/ML  SOLN
75.0000 mL | Freq: Once | INTRAMUSCULAR | Status: AC | PRN
  Administered 2021-04-11: 75 mL via INTRAVENOUS

## 2021-04-11 NOTE — ED Notes (Signed)
Pt placed on 2L [place] after Vernona Rieger, NT checked a sat at 88%. Pt states feeling much better since oxygen has been applied.

## 2021-04-11 NOTE — ED Triage Notes (Signed)
Patient c/o chills, fever, sob, cough, and body aches.

## 2021-04-11 NOTE — ED Notes (Signed)
With patient ambulating from lobby to triage room Oxygen dropped to 89%. After sitting in triage and taking deep breaths O2 goes back up to 94% RA

## 2021-04-11 NOTE — ED Notes (Signed)
Urine POC : NEGATIVE 

## 2021-04-11 NOTE — ED Provider Notes (Signed)
[facility] Emergency Department Provider Note  ____________________________________________  Time seen: Approximately 4:00 PM  I have reviewed the triage vital signs and the nursing notes.   HISTORY  Chief Complaint Shortness of Breath and Chills    HPI Amy Beasley is a 45 y.o. female who presents the emergency department complaining of shortness of breath, cough, chest tightness.  Patient states that symptoms began 3 days ago.  She has a history of bronchitis as well as significant allergies.  She reports that she is a smoker and a lot of times will have allergic rhinitis turned into sinusitis turned into pneumonia.  Patient has had 2 days of low-grade fever that immediately responded to antipyretics.  Patient has had no chest pain.  She does have some mild nasal congestion but not the typical sinus pressure consistent with a sinus infection.  No headaches or or vision changes.  No abdominal complaints.         Past Medical History:  Diagnosis Date  . Amenorrhea   . Irregular menses     Patient Active Problem List   Diagnosis Date Noted  . 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 Morbid obesity (HCC) 12/14/2016  . Tobacco abuse 12/14/2016  . AMA (advanced maternal age) multigravida 35+ 12/14/2016  . Asthma due to environmental allergies 12/14/2016    Past Surgical History:  Procedure Laterality Date  . none      Prior to Admission medications   Medication Sig Start Date End Date Taking? Authorizing Provider  albuterol (VENTOLIN HFA) 108 (90 Base) MCG/ACT inhaler Inhale 2 puffs into the lungs every 2 (two) hours as needed for wheezing or shortness of breath. 04/11/21  Yes Dontrell Stuck, Delorise Royals, PA-C  azithromycin (ZITHROMAX Z-PAK) 250 MG tablet Take 2 tablets (500 mg) on  Day 1,  followed by 1 tablet (250 mg) once daily on Days 2 through 5. 04/11/21  Yes Angelita Harnack, Delorise Royals, PA-C  benzonatate (TESSALON PERLES) 100 MG capsule Take 1 capsule  (100 mg total) by mouth every 6 (six) hours as needed for cough. 04/11/21 04/11/22 Yes Allissa Albright, Delorise Royals, PA-C  brompheniramine-pseudoephedrine-DM 30-2-10 MG/5ML syrup Take 10 mLs by mouth 4 (four) times daily as needed. 04/11/21  Yes Asaph Serena, Delorise Royals, PA-C  cetirizine (ZYRTEC) 10 MG tablet Take 10 mg by mouth daily.    [provider]  omeprazole (PRILOSEC) 20 MG capsule Take 20 mg by mouth daily.    [provider]  Prenatal Vit-Fe Fumarate-FA (PRENATAL MULTIVITAMIN) TABS tablet Take 1 tablet by mouth daily at 12 noon.    [provider]    Allergies Patient has no known allergies.  Family History  Problem Relation Age of Onset  . Asthma Sister   . Cancer Maternal Grandmother        breast  . Diabetes Maternal Grandmother        type 2  . Hyperlipidemia Maternal Grandmother   . Cancer Maternal Grandfather        prostate cancer  . Hypertension Maternal Grandfather   . Eczema Child     Social History Social History   Tobacco Use  . Smoking status: Current Every Day Smoker    Packs/day: 0.50    Types: Cigarettes  . Smokeless tobacco: Never Used  Vaping Use  . Vaping Use: Never used  Substance Use Topics  . Alcohol use: Yes    Comment: occ  . Drug use: No     Review of Systems  Constitutional: Positive fever/chills  Eyes: No visual changes. No discharge ENT: Nasal congestion Cardiovascular: no chest pain. Respiratory: Positive for cough with shortness of breath Gastrointestinal: No abdominal pain.  No nausea, no vomiting.  No diarrhea.  No constipation. Musculoskeletal: Negative for musculoskeletal pain. Skin: Negative for rash, abrasions, lacerations, ecchymosis. Neurological: Negative for headaches, focal weakness or numbness.  10 System ROS otherwise negative.  ____________________________________________   PHYSICAL EXAM:  VITAL SIGNS: ED Triage Vitals  Enc Vitals Group     BP 04/11/21 1223 124/80     Pulse Rate 04/11/21  1223 (!) 107     Resp 04/11/21 1223 (!) 22     Temp 04/11/21 1223 98.3 F (36.8 C)     Temp Source 04/11/21 1223 Oral     SpO2 04/11/21 1223 94 %     Weight 04/11/21 1227 250 lb (113.4 kg)     Height 04/11/21 1227 5\' 4"  (1.626 m)     Head Circumference --      Peak Flow --      Pain Score 04/11/21 1227 0     Pain Loc --      Pain Edu? --      Excl. in GC? --      Constitutional: Alert and oriented. Well appearing and in no acute distress. Eyes: Conjunctivae are normal. PERRL. EOMI. Head: Atraumatic. ENT:      Ears:       Nose: Positive congestion/rhinnorhea.      Mouth/Throat: Mucous membranes are moist.  No oropharyngeal erythema or edema noted. Neck: No stridor.  Hematological/Lymphatic/Immunilogical: No cervical lymphadenopathy. Cardiovascular: Normal rate, regular rhythm. Normal S1 and S2.  Good peripheral circulation. Respiratory: Normal respiratory effort without tachypnea or retractions. Lungs with some faint expiratory wheezing in the middle lobes bilaterally. Good air entry to the bases with no decreased or absent breath sounds. Gastrointestinal: Bowel sounds 4 quadrants. Soft and nontender to palpation. No guarding or rigidity. No palpable masses. No distention. No CVA tenderness. Musculoskeletal: Full range of motion to all extremities. No gross deformities appreciated. Neurologic:  Normal speech and language. No gross focal neurologic deficits are appreciated.  Skin:  Skin is warm, dry and intact. No rash noted. Psychiatric: Mood and affect are normal. Speech and behavior are normal. Patient exhibits appropriate insight and judgement.   ____________________________________________   LABS (all labs ordered are listed, but only abnormal results are displayed)  Labs Reviewed  CBC WITH DIFFERENTIAL/PLATELET - Abnormal; Notable for the following components:      Result Value   WBC 12.7 (*)    Neutro Abs 9.7 (*)    Abs Immature Granulocytes 0.09 (*)    All other  components within normal limits  COMPREHENSIVE METABOLIC PANEL - Abnormal; Notable for the following components:   Glucose, Bld 105 (*)    Calcium 8.7 (*)    Albumin 3.4 (*)    All other components within normal limits  SARS CORONAVIRUS 2 (TAT 6-24 HRS)  POC URINE PREG, ED   ____________________________________________  EKG   ____________________________________________  RADIOLOGY I personally viewed and evaluated these images as part of my medical decision making, as well as reviewing the written report by the radiologist.  ED Provider Interpretation: Ill-defined density in the right midlung.  CT scan reveals findings consistent with pneumonia.  DG Chest 2 View  Result Date: 04/11/2021 CLINICAL DATA:  Cough, shortness of breath and fever. Myalgias. Smoker. EXAM: CHEST - 2 VIEW COMPARISON:  Left ribs radiograph dated 12/11/2005 FINDINGS: Interval small, oval, ill-defined nodular  density in the right mid lung zone. Mild-to-moderate diffuse peribronchial thickening. Normal sized heart. Unremarkable bones. IMPRESSION: 1. Small ill-defined nodular density in the right mid lung zone. Further evaluation with chest CT is recommended. 2. Mild bronchitic changes. Electronically Signed   By: Beckie SaltsSteven  Reid M.D.   On: 04/11/2021 13:23   CT Chest W Contrast  Result Date: 04/11/2021 CLINICAL DATA:  Pneumonia, fever, cough, shortness of breath. EXAM: CT CHEST WITH CONTRAST TECHNIQUE: Multidetector CT imaging of the chest was performed during intravenous contrast administration. CONTRAST:  75mL OMNIPAQUE IOHEXOL 300 MG/ML  SOLN COMPARISON:  Same day chest radiograph. FINDINGS: Cardiovascular: No significant vascular findings. Normal heart size. No pericardial effusion. Mediastinum/Nodes: No enlarged mediastinal, hilar, or axillary lymph nodes. Thyroid gland, trachea, and esophagus demonstrate no significant findings. Lungs/Pleura: Mild-to-moderate bilateral tree-in-bud opacities, right greater than left, are  noted. Three pulmonary nodules with surrounding ground-glass opacities are seen in the right lower lobe, measuring approximately 1.1 cm each of (series 3, image 63). There is no pleural effusion or pneumothorax. Upper Abdomen: Gallstones in the gallbladder are partially imaged. Musculoskeletal: No chest wall abnormality. No acute or significant osseous findings. IMPRESSION: Mild-to-moderate bilateral tree in bud opacities, right greater than left, as well as three pulmonary nodules in the right lower lobe likely reflect pneumonia. Follow-up CT chest is recommended to document resolution. Electronically Signed   By: Romona Curlsyler  Litton M.D.   On: 04/11/2021 17:56    ____________________________________________    PROCEDURES  Procedure(s) performed:    Procedures    Medications  albuterol (PROVENTIL) (2.5 MG/3ML) 0.083% nebulizer solution 2.5 mg (2.5 mg Nebulization Given 04/11/21 1618)  iohexol (OMNIPAQUE) 300 MG/ML solution 75 mL (75 mLs Intravenous Contrast Given 04/11/21 1720)  cefTRIAXone (ROCEPHIN) 1 g in sodium chloride 0.9 % 100 mL IVPB (0 g Intravenous Stopped 04/11/21 1920)  azithromycin (ZITHROMAX) tablet 1,000 mg (1,000 mg Oral Given 04/11/21 1919)     ____________________________________________   INITIAL IMPRESSION / ASSESSMENT AND PLAN / ED COURSE  Pertinent labs & imaging results that were available during my care of the patient were reviewed by me and considered in my medical decision making (see chart for details).  Review of the August CSRS was performed in accordance of the [facility] prior to dispensing any controlled drugs.           Patient's diagnosis is consistent with acute core pneumonia.  Patient presented to the emergency department complaining of chest tightness, shortness of breath.  She states that she has bad seasonal allergies, typically will develop a sinus infection that then developed into pneumonia.  She states that she has had increased nasal congestion but none of  the sinus pressure typical with a sinus infection.  She stated that she started having coughing, shortness of breath and some intermittent fevers.  Overall exam was reassuring with no significant adventitious lung sounds.  She states that at home her oxygen was in the mid to upper 80s with any ambulation.  Patient felt short of breath, and her oxygen had dropped from 94% to 88%.  Patient was started on 2 L of nasal cannula has been maintaining in the upper 90 percentile at this time.  Initial chest x-ray had an ill-defined area in the right lung.  Differential included pneumonia, bronchitis, COVID, lung mass.  CT scan revealed findings consistent with pneumonia..  Patient will be started on Rocephin.  I will ambulate the patient without oxygen at this time determine whether patient is able to maintain her oxygen  saturation and be stable for discharge.  Patient was ambulated without oxygen for few minutes and O2 sat was 94% at its lowest.  At this time patient appears stable for discharge.  Patient received Rocephin, azithromycin here in the emergency department.  Discharge the patient with azithromycin, Bromfed cough syrup, Tessalon Perles and albuterol.  Return precautions discussed at length with the patient.  Follow-up primary care as needed.  Return to the emergency department for new or worsening symptoms.     ____________________________________________  FINAL CLINICAL IMPRESSION(S) / ED DIAGNOSES  Final diagnoses:  Community acquired pneumonia of right middle lobe of lung      NEW MEDICATIONS STARTED DURING THIS VISIT:  ED Discharge Orders         Ordered    azithromycin (ZITHROMAX Z-PAK) 250 MG tablet        04/11/21 1927    albuterol (VENTOLIN HFA) 108 (90 Base) MCG/ACT inhaler  Every 2 hours PRN        04/11/21 1927    brompheniramine-pseudoephedrine-DM 30-2-10 MG/5ML syrup  4 times daily PRN        04/11/21 1927    benzonatate (TESSALON PERLES) 100 MG capsule  Every 6 hours PRN         04/11/21 1927              This chart was dictated using voice recognition software/Dragon. Despite best efforts to proofread, errors can occur which can change the meaning. Any change was purely unintentional.    Racheal Patches, PA-C 04/11/21 Kathi Ludwig, MD 04/12/21 [phone]

## 2021-04-12 LAB — SARS CORONAVIRUS 2 (TAT 6-24 HRS): SARS Coronavirus 2: NEGATIVE

## 2022-04-06 IMAGING — CT CT CHEST W/ CM
2 of 3 series · 15 of 36 positions shown, 18 images · IV contrast (omnipaque)
Comparison: Same day chest radiograph.

CLINICAL DATA: Pneumonia, fever, cough, shortness of breath.

EXAM:
CT CHEST WITH CONTRAST
TECHNIQUE: Multidetector CT imaging of the chest was performed during
intravenous contrast administration.
CONTRAST:  75mL OMNIPAQUE IOHEXOL 300 MG/ML  SOLN

[Series 2: axial st · axial · 0.71mm/px · z∈[-292,-30]mm · 12 of 155 slices shown, 15 images]
[im 12/155  mediastinal]
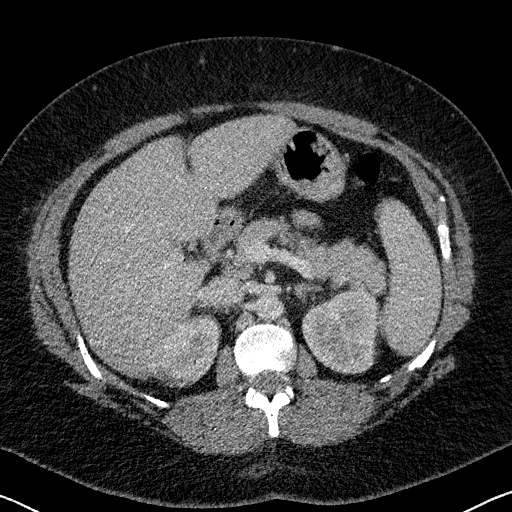
[im 12/155  lung]
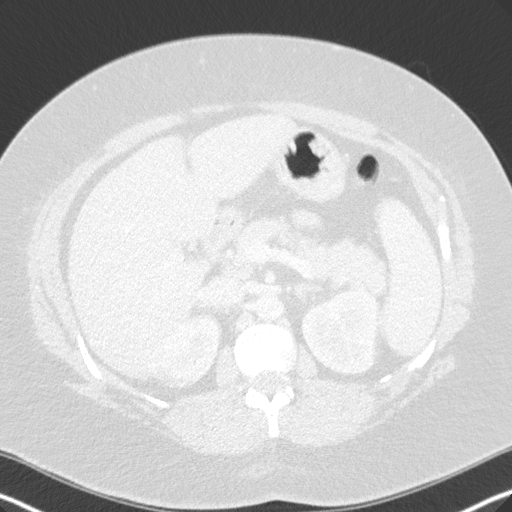
[im 23/155  lung]
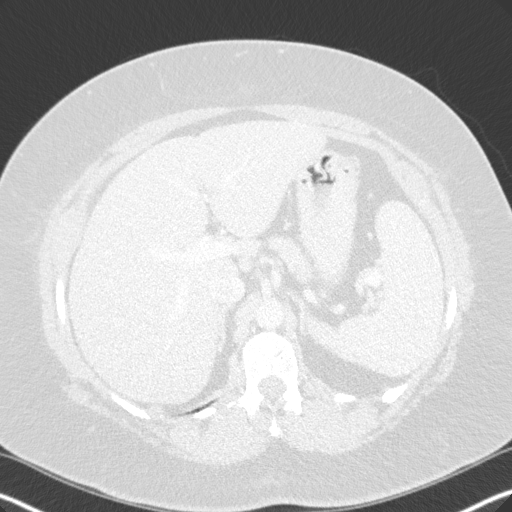
[im 35/155  lung]
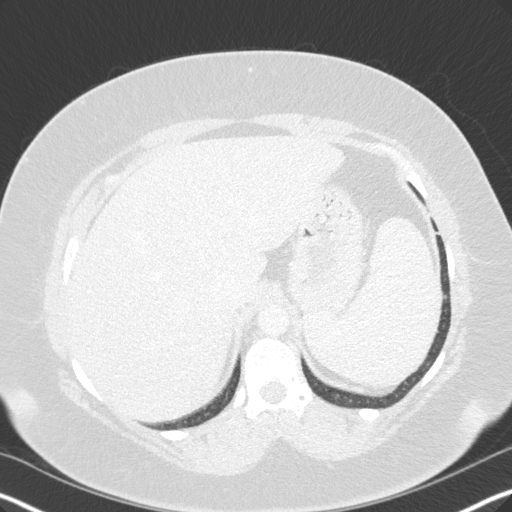
[im 46/155  lung]
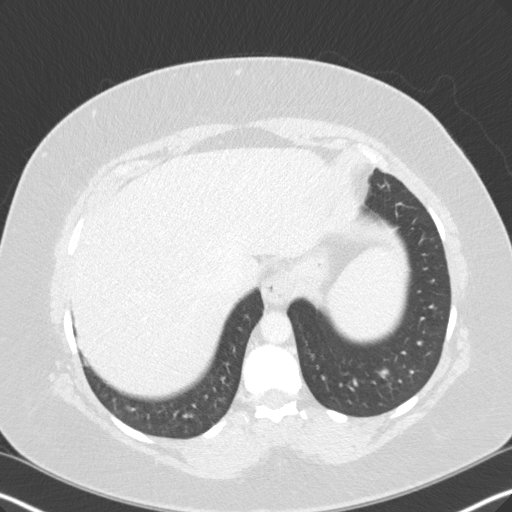
[im 58/155  mediastinal]
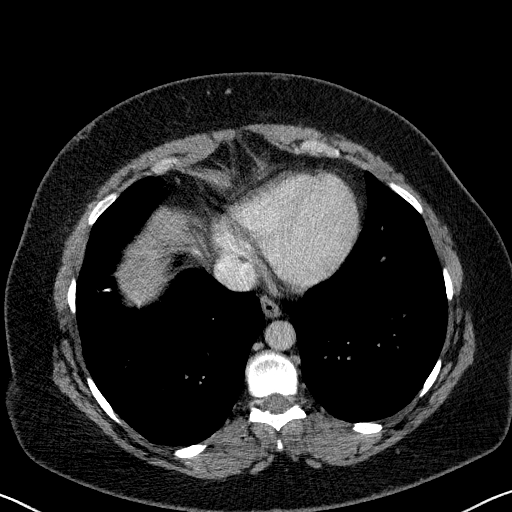
[im 58/155  lung]
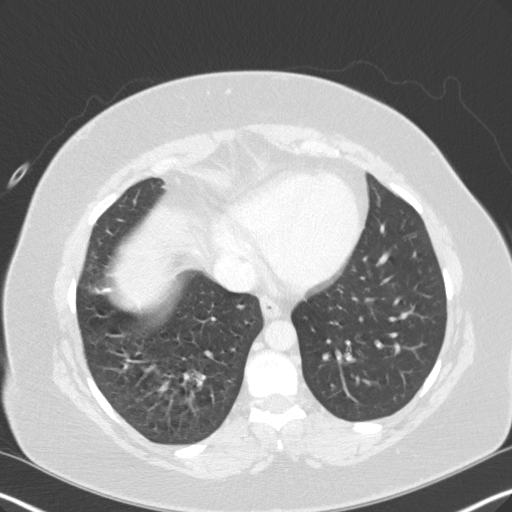
[im 69/155  lung]
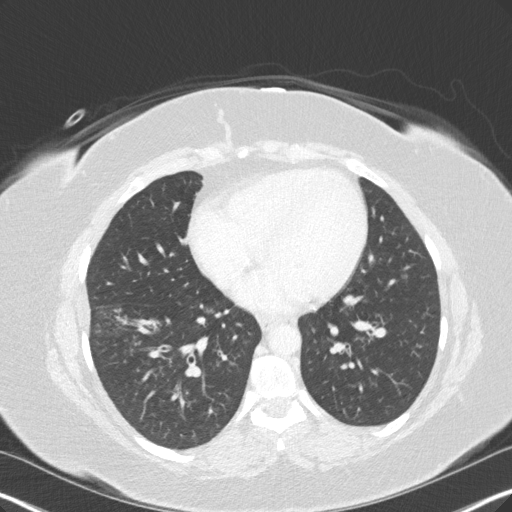
[im 86/155  lung]
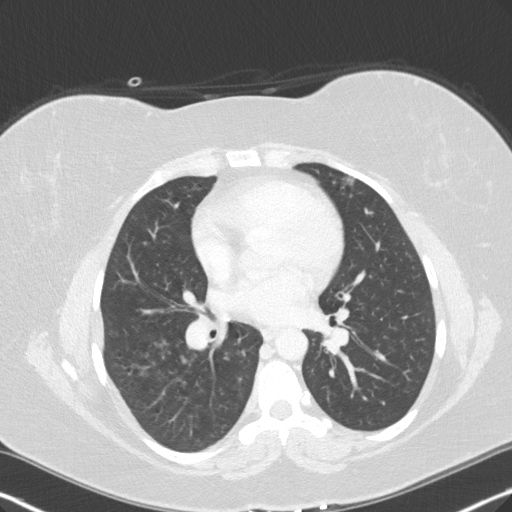
[im 97/155  lung]
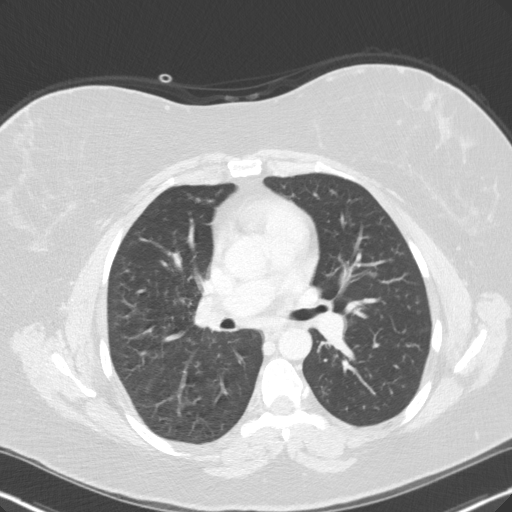
[im 109/155  mediastinal]
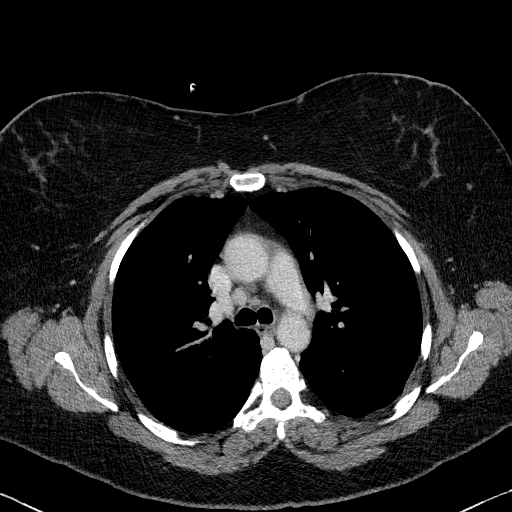
[im 109/155  lung]
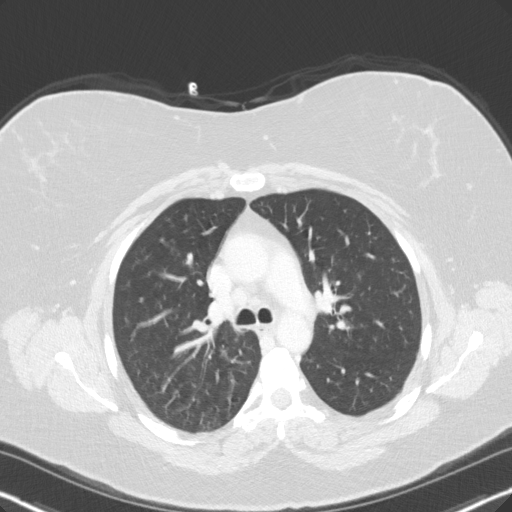
[im 120/155  lung]
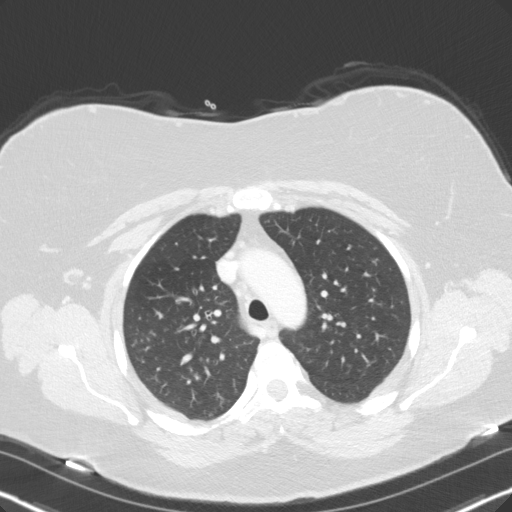
[im 132/155  lung]
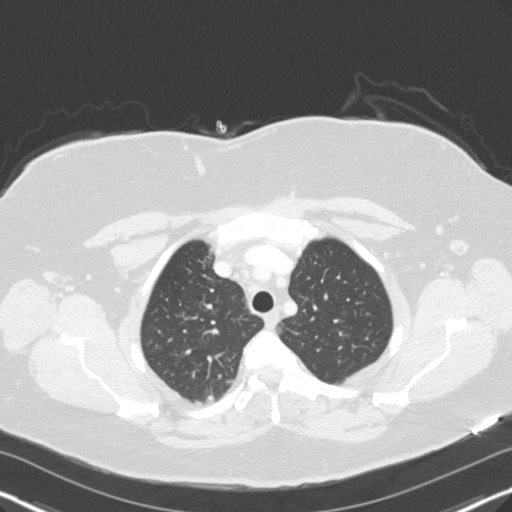
[im 143/155  lung]
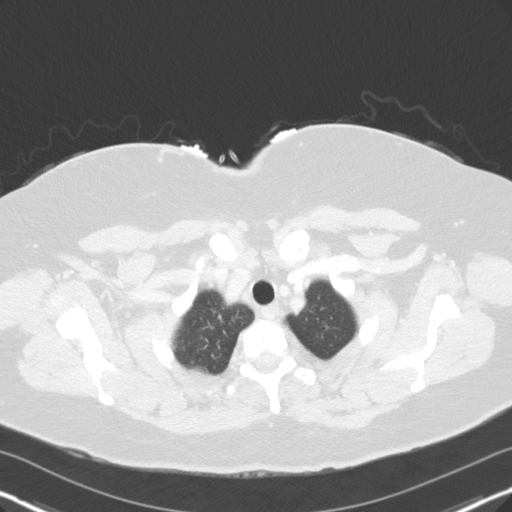

[Series 5: coronal · coronal · 0.62mm/px · 3 of 169 slices shown]
[im 34/169  lung]
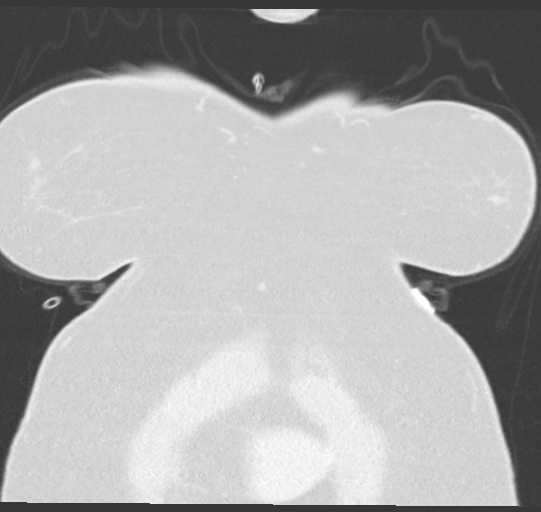
[im 68/169  lung]
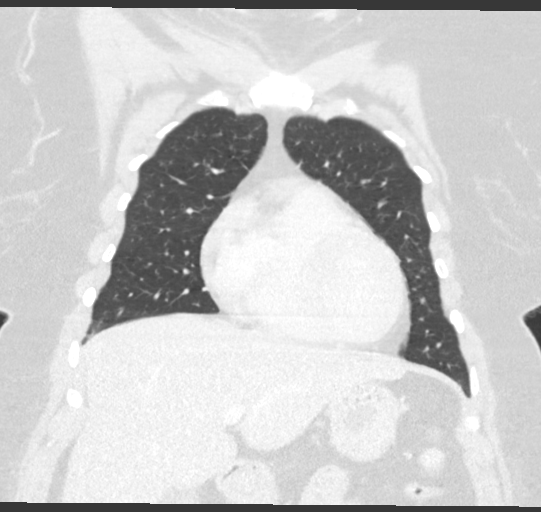
[im 101/169  lung]
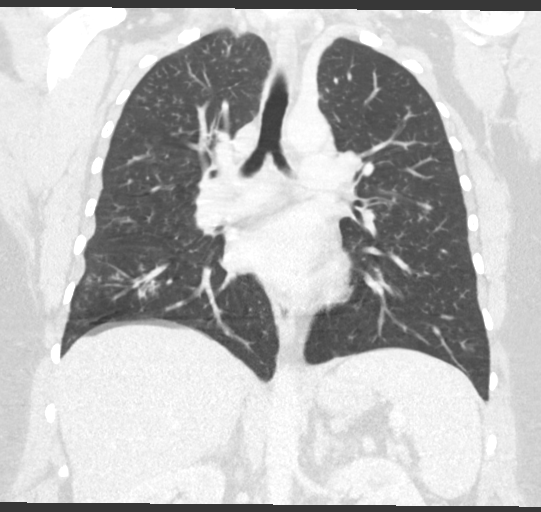

[15 of 36 positions shown; findings below may reference images not displayed]

FINDINGS: Cardiovascular: No significant vascular findings. Normal heart size.
No pericardial effusion.

Mediastinum/Nodes: No enlarged mediastinal, hilar, or axillary lymph
nodes. Thyroid gland, trachea, and esophagus demonstrate no
significant findings.

Lungs/Pleura: Mild-to-moderate bilateral tree-in-bud opacities,
right greater than left, are noted. Three pulmonary nodules with
surrounding ground-glass opacities are seen in the right lower lobe,
measuring approximately 1.1 cm each of (series 3, image 63). There
is no pleural effusion or pneumothorax.

Upper Abdomen: Gallstones in the gallbladder are partially imaged.

Musculoskeletal: No chest wall abnormality. No acute or significant
osseous findings.
IMPRESSION: Mild-to-moderate bilateral tree in bud opacities, right greater than
left, as well as three pulmonary nodules in the right lower lobe
likely reflect pneumonia. Follow-up CT chest is recommended to
document resolution.

## 2022-04-06 IMAGING — CR DG CHEST 2V
1 series · 2 of 2 positions shown · non-contrast
Comparison: Left ribs radiograph dated 12/11/2005

CLINICAL DATA: Cough, shortness of breath and fever. Myalgias.
Smoker.

EXAM:
CHEST - 2 VIEW

[Series 1: dg chest 2 view · 0.14mm/px · 2 of 2 slices shown]
[im 1/2]
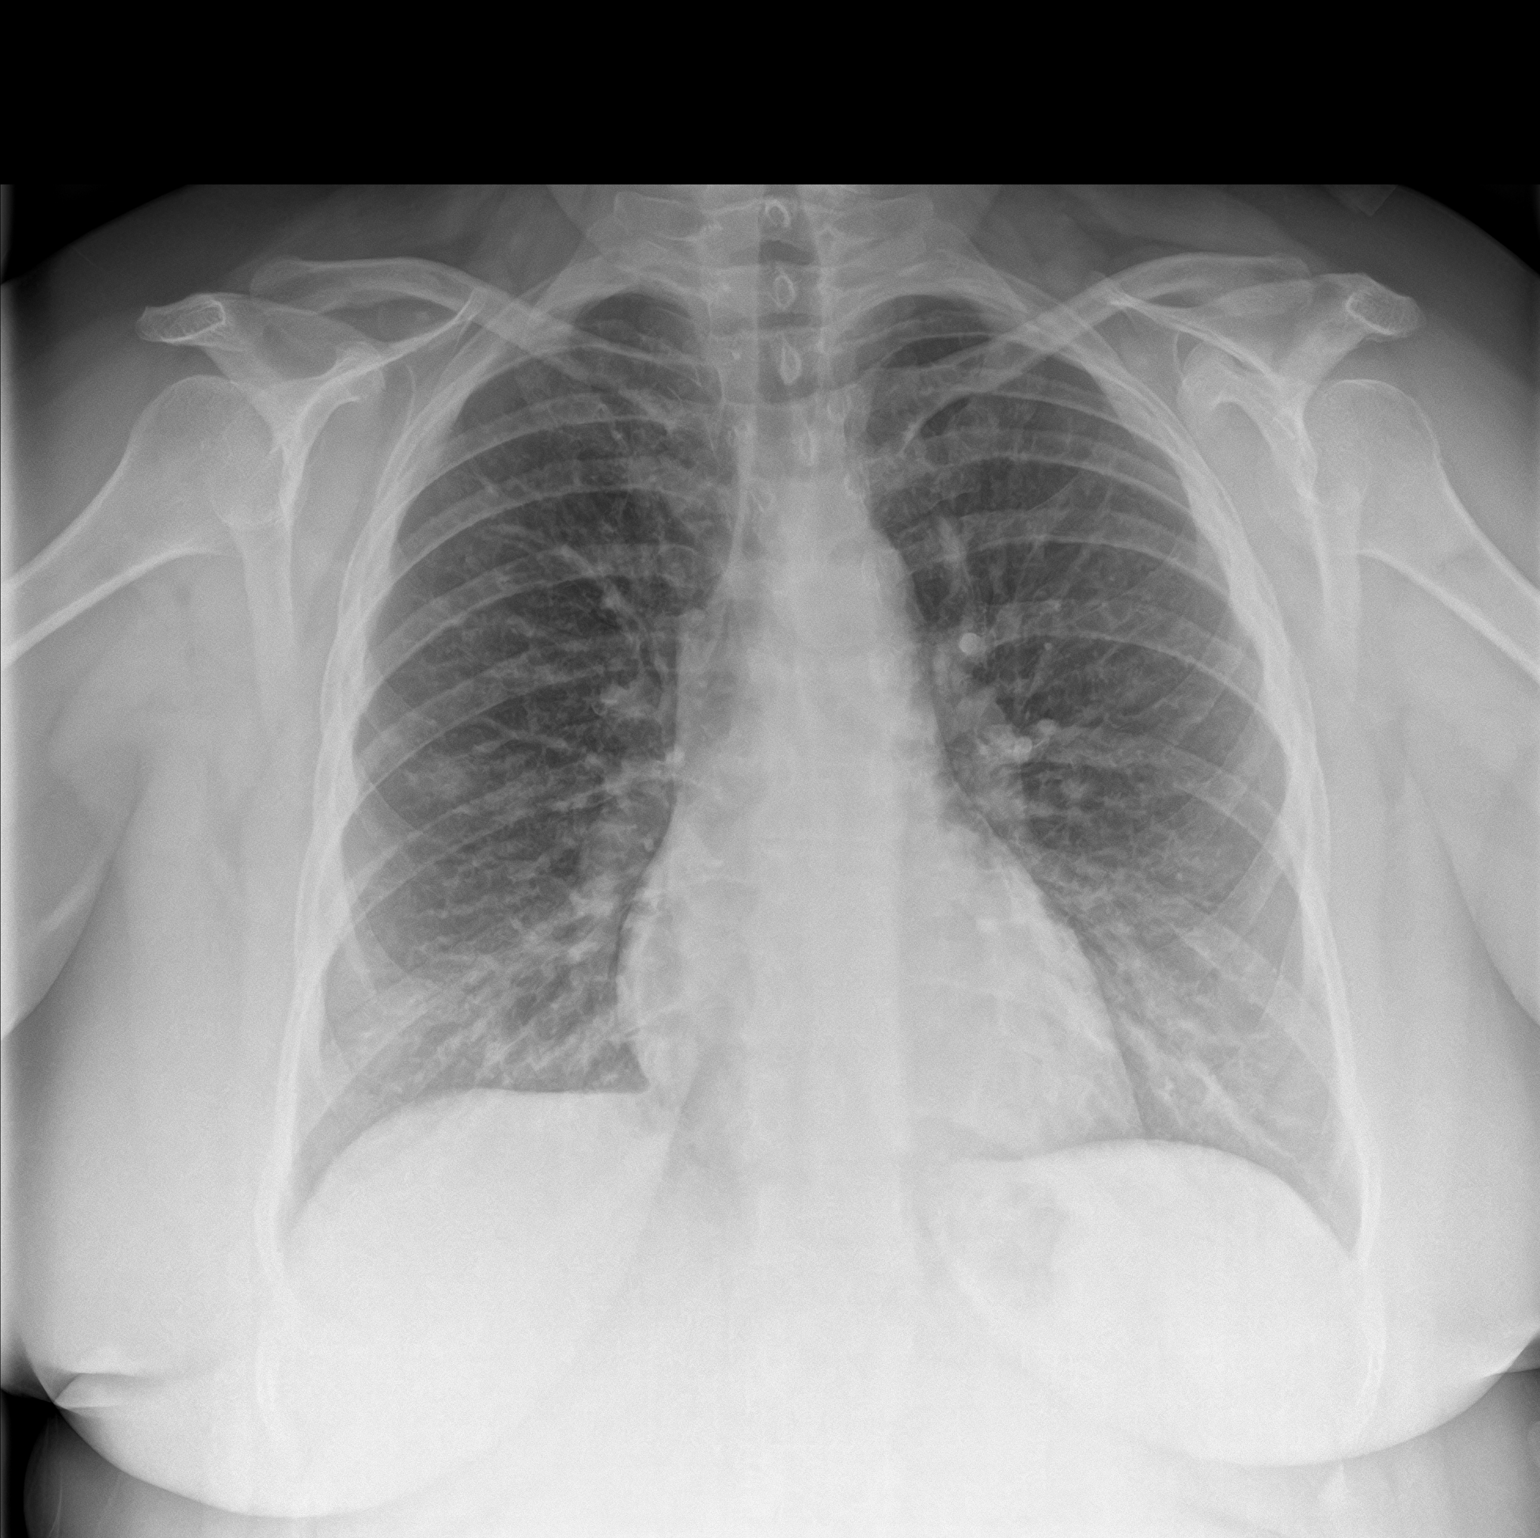
[im 2/2]
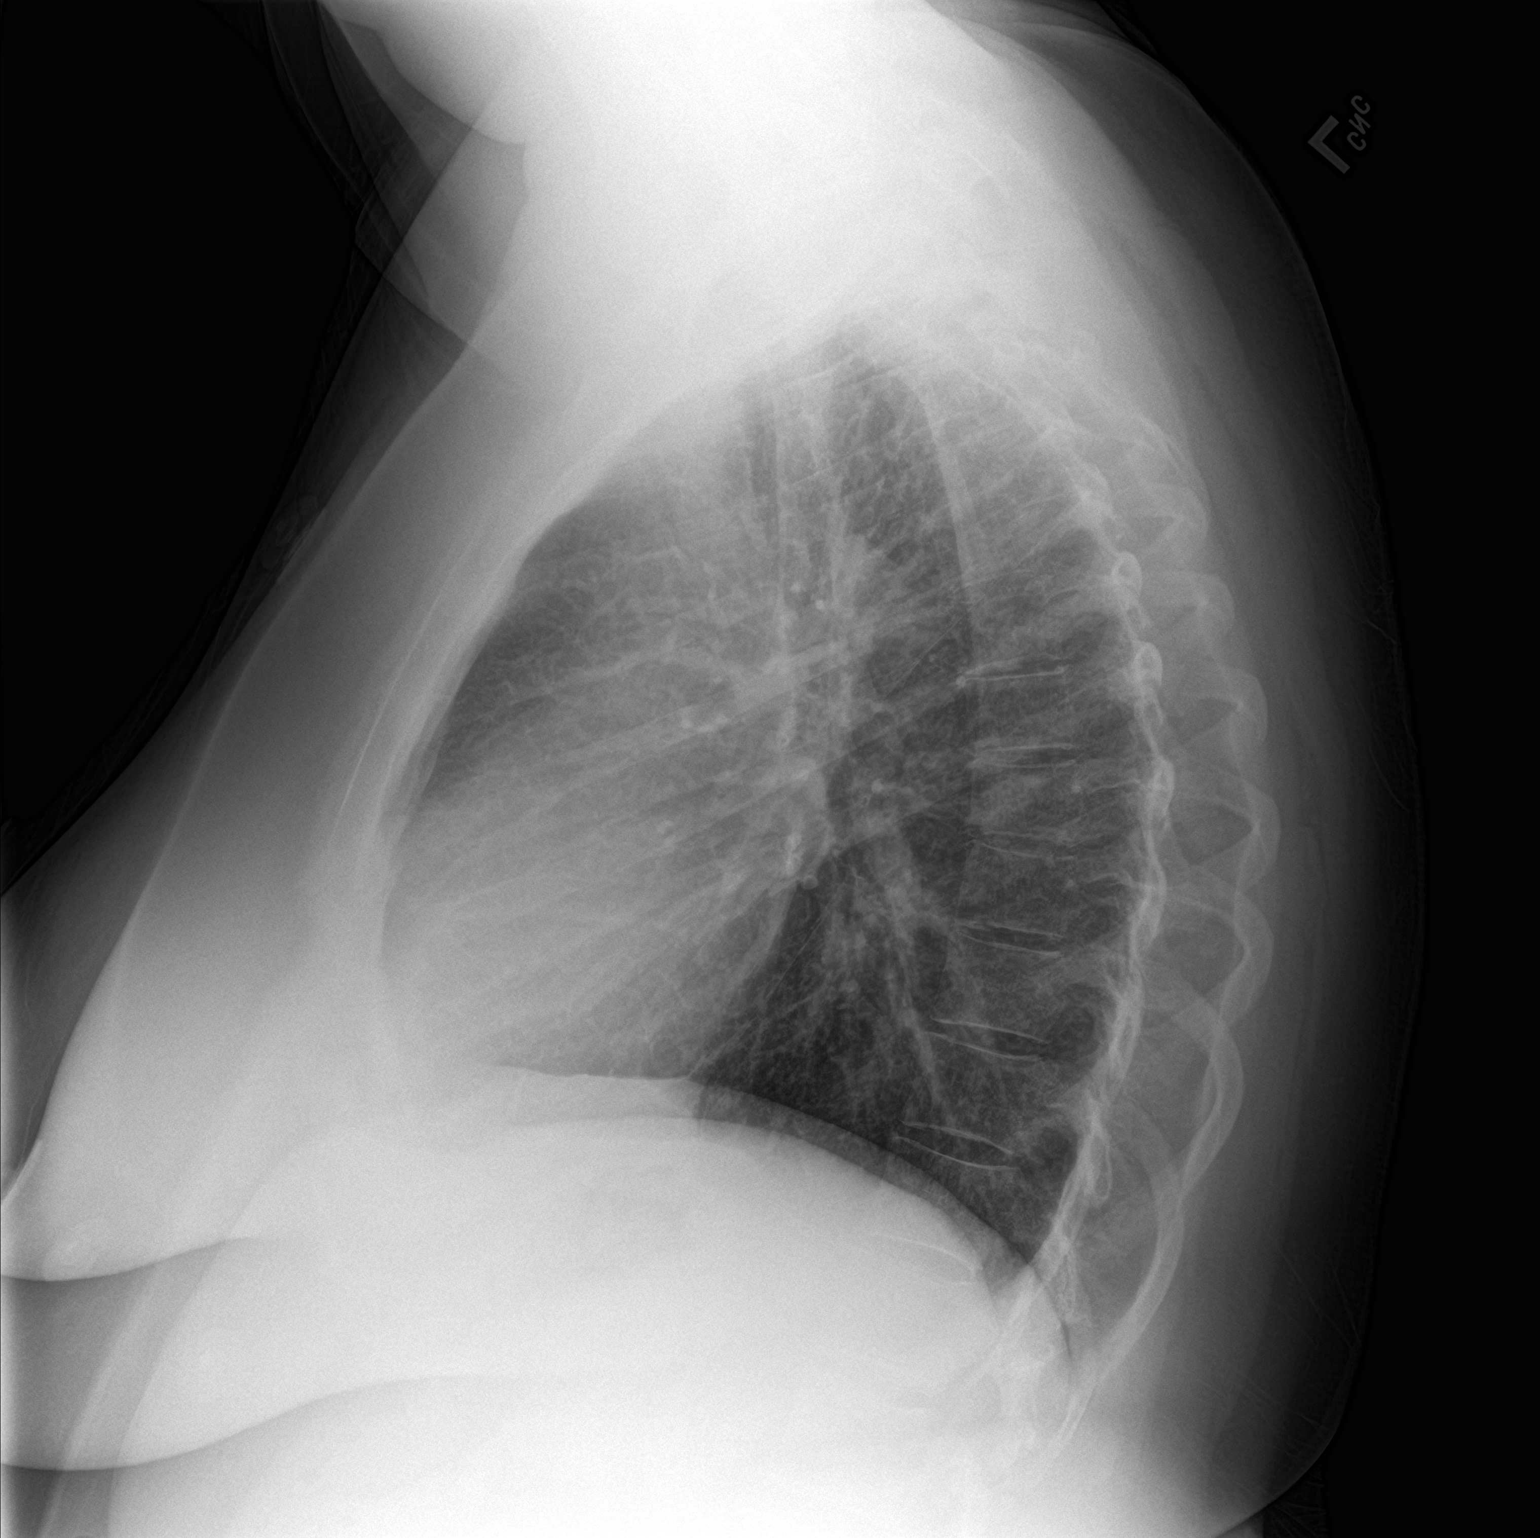

[2 of 2 positions shown; findings below may reference images not displayed]

FINDINGS: Interval small, oval, ill-defined nodular density in the right mid
lung zone. Mild-to-moderate diffuse peribronchial thickening. Normal
sized heart. Unremarkable bones.
IMPRESSION: 1. Small ill-defined nodular density in the right mid lung zone.
Further evaluation with chest CT is recommended.
2. Mild bronchitic changes.
